# Patient Record
Sex: Female | Born: 1935
Health system: Southern US, Community
[De-identification: ages and names within clinical notes are randomized; demographics above are authoritative.]

## PROBLEM LIST (undated history)

## (undated) DIAGNOSIS — C679 Malignant neoplasm of bladder, unspecified: Secondary | ICD-10-CM

## (undated) DIAGNOSIS — E785 Hyperlipidemia, unspecified: Secondary | ICD-10-CM

## (undated) DIAGNOSIS — F0390 Unspecified dementia without behavioral disturbance: Secondary | ICD-10-CM

## (undated) DIAGNOSIS — I10 Essential (primary) hypertension: Secondary | ICD-10-CM

## (undated) HISTORY — PX: RADICAL HYSTERECTOMY WITH TRANSPOSITION OF OVARIES: SHX6222

## (undated) HISTORY — PX: CHOLECYSTECTOMY: SHX55

## (undated) HISTORY — DX: Hyperlipidemia, unspecified: E78.5

## (undated) HISTORY — DX: Essential (primary) hypertension: I10

---

## 1999-02-21 ENCOUNTER — Encounter: Payer: Self-pay | Admitting: Urology

## 1999-02-26 ENCOUNTER — Ambulatory Visit (HOSPITAL_COMMUNITY): Admission: RE | Admit: 1999-02-26 | Discharge: 1999-02-26 | Payer: Self-pay | Admitting: Urology

## 1999-06-23 ENCOUNTER — Ambulatory Visit (HOSPITAL_COMMUNITY): Admission: RE | Admit: 1999-06-23 | Discharge: 1999-06-23 | Payer: Self-pay | Admitting: Urology

## 1999-08-06 ENCOUNTER — Ambulatory Visit (HOSPITAL_COMMUNITY): Admission: RE | Admit: 1999-08-06 | Discharge: 1999-08-06 | Payer: Self-pay | Admitting: Urology

## 1999-08-19 ENCOUNTER — Ambulatory Visit (HOSPITAL_COMMUNITY): Admission: RE | Admit: 1999-08-19 | Discharge: 1999-08-19 | Payer: Self-pay | Admitting: Gastroenterology

## 2000-03-11 ENCOUNTER — Ambulatory Visit (HOSPITAL_COMMUNITY): Admission: RE | Admit: 2000-03-11 | Discharge: 2000-03-11 | Payer: Self-pay | Admitting: Urology

## 2000-09-21 ENCOUNTER — Encounter: Payer: Self-pay | Admitting: Urology

## 2000-09-22 ENCOUNTER — Ambulatory Visit (HOSPITAL_COMMUNITY): Admission: RE | Admit: 2000-09-22 | Discharge: 2000-09-22 | Payer: Self-pay | Admitting: Urology

## 2000-10-21 ENCOUNTER — Other Ambulatory Visit: Admission: RE | Admit: 2000-10-21 | Discharge: 2000-10-21 | Payer: Self-pay | Admitting: Urology

## 2001-07-11 ENCOUNTER — Encounter: Admission: RE | Admit: 2001-07-11 | Discharge: 2001-07-11 | Payer: Self-pay | Admitting: Urology

## 2001-07-11 ENCOUNTER — Encounter: Payer: Self-pay | Admitting: Urology

## 2001-07-13 ENCOUNTER — Ambulatory Visit (HOSPITAL_BASED_OUTPATIENT_CLINIC_OR_DEPARTMENT_OTHER): Admission: RE | Admit: 2001-07-13 | Discharge: 2001-07-13 | Payer: Self-pay | Admitting: Urology

## 2001-12-08 ENCOUNTER — Ambulatory Visit (HOSPITAL_BASED_OUTPATIENT_CLINIC_OR_DEPARTMENT_OTHER): Admission: RE | Admit: 2001-12-08 | Discharge: 2001-12-08 | Payer: Self-pay | Admitting: Urology

## 2002-02-28 ENCOUNTER — Other Ambulatory Visit: Admission: RE | Admit: 2002-02-28 | Discharge: 2002-02-28 | Payer: Self-pay | Admitting: Urology

## 2002-06-15 ENCOUNTER — Ambulatory Visit (HOSPITAL_COMMUNITY): Admission: RE | Admit: 2002-06-15 | Discharge: 2002-06-15 | Payer: Self-pay | Admitting: Urology

## 2002-11-27 ENCOUNTER — Ambulatory Visit (HOSPITAL_COMMUNITY): Admission: RE | Admit: 2002-11-27 | Discharge: 2002-11-27 | Payer: Self-pay | Admitting: Gastroenterology

## 2003-02-16 ENCOUNTER — Encounter: Admission: RE | Admit: 2003-02-16 | Discharge: 2003-02-16 | Payer: Self-pay | Admitting: Urology

## 2003-02-16 ENCOUNTER — Encounter: Payer: Self-pay | Admitting: Urology

## 2003-02-21 ENCOUNTER — Ambulatory Visit (HOSPITAL_BASED_OUTPATIENT_CLINIC_OR_DEPARTMENT_OTHER): Admission: RE | Admit: 2003-02-21 | Discharge: 2003-02-21 | Payer: Self-pay | Admitting: Urology

## 2003-08-08 ENCOUNTER — Ambulatory Visit (HOSPITAL_COMMUNITY): Admission: RE | Admit: 2003-08-08 | Discharge: 2003-08-08 | Payer: Self-pay | Admitting: Urology

## 2003-08-08 ENCOUNTER — Ambulatory Visit (HOSPITAL_BASED_OUTPATIENT_CLINIC_OR_DEPARTMENT_OTHER): Admission: RE | Admit: 2003-08-08 | Discharge: 2003-08-08 | Payer: Self-pay | Admitting: Urology

## 2004-02-04 ENCOUNTER — Encounter: Admission: RE | Admit: 2004-02-04 | Discharge: 2004-02-04 | Payer: Self-pay | Admitting: Urology

## 2004-02-06 ENCOUNTER — Ambulatory Visit (HOSPITAL_BASED_OUTPATIENT_CLINIC_OR_DEPARTMENT_OTHER): Admission: RE | Admit: 2004-02-06 | Discharge: 2004-02-06 | Payer: Self-pay | Admitting: Urology

## 2004-02-06 ENCOUNTER — Ambulatory Visit (HOSPITAL_COMMUNITY): Admission: RE | Admit: 2004-02-06 | Discharge: 2004-02-06 | Payer: Self-pay | Admitting: Urology

## 2004-03-26 ENCOUNTER — Other Ambulatory Visit: Admission: RE | Admit: 2004-03-26 | Discharge: 2004-03-26 | Payer: Self-pay | Admitting: Obstetrics and Gynecology

## 2011-03-09 ENCOUNTER — Other Ambulatory Visit: Payer: Self-pay | Admitting: Obstetrics and Gynecology

## 2011-03-09 ENCOUNTER — Other Ambulatory Visit (HOSPITAL_COMMUNITY)
Admission: RE | Admit: 2011-03-09 | Discharge: 2011-03-09 | Disposition: A | Discharge status: Home / Self Care | Payer: Medicare Other | Source: Ambulatory Visit | Attending: Obstetrics and Gynecology | Admitting: Obstetrics and Gynecology

## 2011-03-09 DIAGNOSIS — Z124 Encounter for screening for malignant neoplasm of cervix: Secondary | ICD-10-CM | POA: Insufficient documentation

## 2011-04-13 ENCOUNTER — Ambulatory Visit: Payer: Self-pay | Admitting: Gynecologic Oncology

## 2011-04-14 ENCOUNTER — Ambulatory Visit: Payer: Self-pay | Admitting: Cardiothoracic Surgery

## 2011-04-17 ENCOUNTER — Ambulatory Visit: Payer: Self-pay | Admitting: Gynecologic Oncology

## 2011-04-27 ENCOUNTER — Ambulatory Visit: Payer: Self-pay | Admitting: Gynecologic Oncology

## 2011-04-29 ENCOUNTER — Inpatient Hospital Stay: Payer: Self-pay | Admitting: Oncology

## 2011-04-30 LAB — PATHOLOGY REPORT

## 2011-05-02 ENCOUNTER — Ambulatory Visit: Payer: Self-pay | Admitting: Cardiothoracic Surgery

## 2011-05-28 ENCOUNTER — Ambulatory Visit: Payer: Self-pay | Admitting: Gynecologic Oncology

## 2011-05-28 ENCOUNTER — Ambulatory Visit: Payer: Self-pay | Admitting: Cardiothoracic Surgery

## 2011-08-18 DIAGNOSIS — Z1231 Encounter for screening mammogram for malignant neoplasm of breast: Secondary | ICD-10-CM | POA: Diagnosis not present

## 2011-10-14 DIAGNOSIS — I10 Essential (primary) hypertension: Secondary | ICD-10-CM | POA: Diagnosis not present

## 2011-10-14 DIAGNOSIS — Z1331 Encounter for screening for depression: Secondary | ICD-10-CM | POA: Diagnosis not present

## 2011-10-14 DIAGNOSIS — K219 Gastro-esophageal reflux disease without esophagitis: Secondary | ICD-10-CM | POA: Diagnosis not present

## 2011-10-14 DIAGNOSIS — E7800 Pure hypercholesterolemia, unspecified: Secondary | ICD-10-CM | POA: Diagnosis not present

## 2011-10-14 DIAGNOSIS — N183 Chronic kidney disease, stage 3 unspecified: Secondary | ICD-10-CM | POA: Diagnosis not present

## 2011-11-16 DIAGNOSIS — H04129 Dry eye syndrome of unspecified lacrimal gland: Secondary | ICD-10-CM | POA: Diagnosis not present

## 2011-12-01 DIAGNOSIS — Z09 Encounter for follow-up examination after completed treatment for conditions other than malignant neoplasm: Secondary | ICD-10-CM | POA: Diagnosis not present

## 2011-12-01 DIAGNOSIS — Z86010 Personal history of colonic polyps: Secondary | ICD-10-CM | POA: Diagnosis not present

## 2011-12-01 DIAGNOSIS — K5730 Diverticulosis of large intestine without perforation or abscess without bleeding: Secondary | ICD-10-CM | POA: Diagnosis not present

## 2011-12-08 DIAGNOSIS — Z8551 Personal history of malignant neoplasm of bladder: Secondary | ICD-10-CM | POA: Diagnosis not present

## 2011-12-08 DIAGNOSIS — N3941 Urge incontinence: Secondary | ICD-10-CM | POA: Diagnosis not present

## 2011-12-08 DIAGNOSIS — R82998 Other abnormal findings in urine: Secondary | ICD-10-CM | POA: Diagnosis not present

## 2011-12-17 DIAGNOSIS — Z8551 Personal history of malignant neoplasm of bladder: Secondary | ICD-10-CM | POA: Diagnosis not present

## 2012-04-28 DIAGNOSIS — Z23 Encounter for immunization: Secondary | ICD-10-CM | POA: Diagnosis not present

## 2012-06-03 DIAGNOSIS — E7800 Pure hypercholesterolemia, unspecified: Secondary | ICD-10-CM | POA: Diagnosis not present

## 2012-06-03 DIAGNOSIS — I10 Essential (primary) hypertension: Secondary | ICD-10-CM | POA: Diagnosis not present

## 2012-06-03 DIAGNOSIS — R609 Edema, unspecified: Secondary | ICD-10-CM | POA: Diagnosis not present

## 2012-06-20 DIAGNOSIS — M722 Plantar fascial fibromatosis: Secondary | ICD-10-CM | POA: Diagnosis not present

## 2012-07-11 DIAGNOSIS — I10 Essential (primary) hypertension: Secondary | ICD-10-CM | POA: Diagnosis not present

## 2012-08-29 DIAGNOSIS — N183 Chronic kidney disease, stage 3 unspecified: Secondary | ICD-10-CM | POA: Diagnosis not present

## 2012-08-29 DIAGNOSIS — K219 Gastro-esophageal reflux disease without esophagitis: Secondary | ICD-10-CM | POA: Diagnosis not present

## 2012-08-29 DIAGNOSIS — I10 Essential (primary) hypertension: Secondary | ICD-10-CM | POA: Diagnosis not present

## 2012-08-29 DIAGNOSIS — K5900 Constipation, unspecified: Secondary | ICD-10-CM | POA: Diagnosis not present

## 2012-09-14 DIAGNOSIS — Z1231 Encounter for screening mammogram for malignant neoplasm of breast: Secondary | ICD-10-CM | POA: Diagnosis not present

## 2012-09-14 DIAGNOSIS — D649 Anemia, unspecified: Secondary | ICD-10-CM | POA: Diagnosis not present

## 2012-09-14 DIAGNOSIS — R7301 Impaired fasting glucose: Secondary | ICD-10-CM | POA: Diagnosis not present

## 2012-09-28 DIAGNOSIS — Z01419 Encounter for gynecological examination (general) (routine) without abnormal findings: Secondary | ICD-10-CM | POA: Diagnosis not present

## 2012-11-18 DIAGNOSIS — H04129 Dry eye syndrome of unspecified lacrimal gland: Secondary | ICD-10-CM | POA: Diagnosis not present

## 2012-12-01 DIAGNOSIS — N183 Chronic kidney disease, stage 3 unspecified: Secondary | ICD-10-CM | POA: Diagnosis not present

## 2012-12-01 DIAGNOSIS — E7800 Pure hypercholesterolemia, unspecified: Secondary | ICD-10-CM | POA: Diagnosis not present

## 2012-12-01 DIAGNOSIS — Z1331 Encounter for screening for depression: Secondary | ICD-10-CM | POA: Diagnosis not present

## 2012-12-01 DIAGNOSIS — I10 Essential (primary) hypertension: Secondary | ICD-10-CM | POA: Diagnosis not present

## 2012-12-12 DIAGNOSIS — Z8551 Personal history of malignant neoplasm of bladder: Secondary | ICD-10-CM | POA: Diagnosis not present

## 2013-05-01 DIAGNOSIS — Z23 Encounter for immunization: Secondary | ICD-10-CM | POA: Diagnosis not present

## 2013-06-01 DIAGNOSIS — N183 Chronic kidney disease, stage 3 unspecified: Secondary | ICD-10-CM | POA: Diagnosis not present

## 2013-06-01 DIAGNOSIS — K219 Gastro-esophageal reflux disease without esophagitis: Secondary | ICD-10-CM | POA: Diagnosis not present

## 2013-06-01 DIAGNOSIS — I10 Essential (primary) hypertension: Secondary | ICD-10-CM | POA: Diagnosis not present

## 2013-06-01 DIAGNOSIS — E7800 Pure hypercholesterolemia, unspecified: Secondary | ICD-10-CM | POA: Diagnosis not present

## 2013-06-21 DIAGNOSIS — M899 Disorder of bone, unspecified: Secondary | ICD-10-CM | POA: Diagnosis not present

## 2013-08-03 DIAGNOSIS — I10 Essential (primary) hypertension: Secondary | ICD-10-CM | POA: Diagnosis not present

## 2013-09-15 DIAGNOSIS — R922 Inconclusive mammogram: Secondary | ICD-10-CM | POA: Diagnosis not present

## 2013-09-15 DIAGNOSIS — Z1231 Encounter for screening mammogram for malignant neoplasm of breast: Secondary | ICD-10-CM | POA: Diagnosis not present

## 2013-09-27 ENCOUNTER — Encounter: Payer: Self-pay | Admitting: Podiatry

## 2013-09-27 ENCOUNTER — Ambulatory Visit (INDEPENDENT_AMBULATORY_CARE_PROVIDER_SITE_OTHER): Payer: Medicare Other | Admitting: Podiatry

## 2013-09-27 VITALS — BP 153/69 | HR 57 | Resp 16 | Ht 62.0 in | Wt 112.0 lb

## 2013-09-27 DIAGNOSIS — L03039 Cellulitis of unspecified toe: Secondary | ICD-10-CM

## 2013-09-27 NOTE — Progress Notes (Signed)
   Subjective:    Patient ID: Pamela Woods, female    DOB: 05-12-1922, 80 y.o.   MRN: [id number]  HPI N ingrown toenail        L right 1st medial toenail        D and O over 1 week        C pain        A pressure, and enclosed shoes        T to painful to touch or trim    Review of Systems  All other systems reviewed and are negative.       Objective:   Physical Exam        Assessment & Plan:

## 2013-09-27 NOTE — Patient Instructions (Signed)

## 2013-09-28 ENCOUNTER — Ambulatory Visit: Payer: Self-pay | Admitting: Podiatry

## 2013-09-28 NOTE — Progress Notes (Signed)
Subjective:     Patient ID: Pamela Woods, female   DOB: 07-05-1922, 80 y.o.   MRN: [id number]  HPI patient points with and painful ingrown toenail right hallux medial border that she's tried to trim and soak without relief. Presents with caregiver   Review of Systems     Objective:   Physical Exam Neurovascular status unchanged with patient well oriented x3 with incurvated medial border the right hallux is painful with slight distal redness noted    Assessment:     Paronychia the right hallux medial border    Plan:     H&P performed and today have recommended removal the border. Infiltrated the right hallux 60 mg Xylocaine Marcaine mixture and remove the medial border and a small amount of proud flesh. Applied sterile dressing and reappoint

## 2013-10-05 DIAGNOSIS — Z01419 Encounter for gynecological examination (general) (routine) without abnormal findings: Secondary | ICD-10-CM | POA: Diagnosis not present

## 2013-11-28 DIAGNOSIS — Z8551 Personal history of malignant neoplasm of bladder: Secondary | ICD-10-CM | POA: Diagnosis not present

## 2013-11-30 DIAGNOSIS — N183 Chronic kidney disease, stage 3 unspecified: Secondary | ICD-10-CM | POA: Diagnosis not present

## 2013-11-30 DIAGNOSIS — I10 Essential (primary) hypertension: Secondary | ICD-10-CM | POA: Diagnosis not present

## 2013-11-30 DIAGNOSIS — E7800 Pure hypercholesterolemia, unspecified: Secondary | ICD-10-CM | POA: Diagnosis not present

## 2013-11-30 DIAGNOSIS — K219 Gastro-esophageal reflux disease without esophagitis: Secondary | ICD-10-CM | POA: Diagnosis not present

## 2013-11-30 DIAGNOSIS — B9789 Other viral agents as the cause of diseases classified elsewhere: Secondary | ICD-10-CM | POA: Diagnosis not present

## 2013-11-30 DIAGNOSIS — H6120 Impacted cerumen, unspecified ear: Secondary | ICD-10-CM | POA: Diagnosis not present

## 2013-12-01 DIAGNOSIS — R42 Dizziness and giddiness: Secondary | ICD-10-CM | POA: Diagnosis not present

## 2013-12-01 DIAGNOSIS — H9209 Otalgia, unspecified ear: Secondary | ICD-10-CM | POA: Diagnosis not present

## 2013-12-08 DIAGNOSIS — H35039 Hypertensive retinopathy, unspecified eye: Secondary | ICD-10-CM | POA: Diagnosis not present

## 2013-12-08 DIAGNOSIS — H43819 Vitreous degeneration, unspecified eye: Secondary | ICD-10-CM | POA: Diagnosis not present

## 2013-12-08 DIAGNOSIS — H04129 Dry eye syndrome of unspecified lacrimal gland: Secondary | ICD-10-CM | POA: Diagnosis not present

## 2013-12-29 DIAGNOSIS — N184 Chronic kidney disease, stage 4 (severe): Secondary | ICD-10-CM | POA: Diagnosis not present

## 2014-03-14 DIAGNOSIS — B351 Tinea unguium: Secondary | ICD-10-CM | POA: Diagnosis not present

## 2014-03-14 DIAGNOSIS — M79609 Pain in unspecified limb: Secondary | ICD-10-CM | POA: Diagnosis not present

## 2014-03-14 DIAGNOSIS — I739 Peripheral vascular disease, unspecified: Secondary | ICD-10-CM | POA: Diagnosis not present

## 2014-04-13 DIAGNOSIS — H43819 Vitreous degeneration, unspecified eye: Secondary | ICD-10-CM | POA: Diagnosis not present

## 2014-04-13 DIAGNOSIS — H35369 Drusen (degenerative) of macula, unspecified eye: Secondary | ICD-10-CM | POA: Diagnosis not present

## 2014-04-13 DIAGNOSIS — H353190 Nonexudative age-related macular degeneration, unspecified eye, stage unspecified: Secondary | ICD-10-CM | POA: Diagnosis not present

## 2014-04-13 DIAGNOSIS — H35039 Hypertensive retinopathy, unspecified eye: Secondary | ICD-10-CM | POA: Diagnosis not present

## 2014-04-19 ENCOUNTER — Encounter: Payer: Self-pay | Admitting: Podiatry

## 2014-04-19 ENCOUNTER — Ambulatory Visit (INDEPENDENT_AMBULATORY_CARE_PROVIDER_SITE_OTHER): Payer: Medicare Other | Admitting: Podiatry

## 2014-04-19 VITALS — BP 135/65 | HR 57 | Resp 16

## 2014-04-19 DIAGNOSIS — L03039 Cellulitis of unspecified toe: Secondary | ICD-10-CM

## 2014-04-19 NOTE — Patient Instructions (Signed)
ANTIBACTERIAL SOAP INSTRUCTIONS  THE DAY AFTER PROCEDURE  Please follow the instructions your doctor has marked.   Shower as usual. Before getting out, place a drop of antibacterial liquid soap (Dial) on a wet, clean washcloth.  Gently wipe washcloth over affected area.  Afterward, rinse the area with warm water.  Blot the area dry with a soft cloth and cover with antibiotic ointment (neosporin, polysporin, bacitracin) and band aid or gauze and tape  OR   Place 3-4 drops of antibacterial liquid soap in a quart of warm tap water.  Submerge foot into water for 20 minutes.  If bandage was applied after your procedure, leave on to allow for easy lift off, then remove and continue with soak for the remaining time.  Next, blot area dry with a soft cloth and cover with a bandage.  Apply other medications as directed by your doctor, such as cortisporin otic solution (eardrops) or neosporin antibiotic ointment   Long Term Care Instructions-Post Nail Surgery  You have had your ingrown toenail and root treated with a chemical.  This chemical causes a burn that will drain and ooze like a blister.  This can drain for 6-8 weeks or longer.  It is important to keep this area clean, covered, and follow the soaking instructions dispensed at the time of your surgery.  This area will eventually dry and form a scab.  Once the scab forms you no longer need to soak or apply a dressing.  If at any time you experience an increase in pain, redness, swelling, or drainage, you should contact the office as soon as possible.

## 2014-04-20 NOTE — Progress Notes (Signed)
Subjective:     Patient ID: Pamela Woods, female   DOB: 01-17-1922, 80 y.o.   MRN: [id number]  HPI patient presents with daughter with inflammation and pain around the medial border of the left hallux nail stating she was in [place] and had trim which did not help her  Review of Systems     Objective:   Physical Exam Neurovascular status is intact with no other changes in health history with inflamed left hallux medial border and pain with digits that are noted to be well perfused    Assessment:     Paronychia infection that is localized of the left hallux medial border    Plan:     Condition discussed and today I infiltrated 60 mg Xylocaine Marcaine mixture and applied sterile prep to the hallux. Using sterile his mentation I removed the medial border proud flesh and necrotic tissue and allowed channel for drainage. This appears to be localized with no proximal issues and healing were to not occur or if there is any redness swelling or drainage patient and patient's daughter are to let us know and come in for check

## 2014-04-24 DIAGNOSIS — H9190 Unspecified hearing loss, unspecified ear: Secondary | ICD-10-CM | POA: Diagnosis not present

## 2014-04-24 DIAGNOSIS — H6120 Impacted cerumen, unspecified ear: Secondary | ICD-10-CM | POA: Diagnosis not present

## 2014-04-29 DIAGNOSIS — Z23 Encounter for immunization: Secondary | ICD-10-CM | POA: Diagnosis not present

## 2014-05-09 DIAGNOSIS — H9193 Unspecified hearing loss, bilateral: Secondary | ICD-10-CM | POA: Diagnosis not present

## 2014-05-09 DIAGNOSIS — H9122 Sudden idiopathic hearing loss, left ear: Secondary | ICD-10-CM | POA: Diagnosis not present

## 2014-05-09 DIAGNOSIS — H905 Unspecified sensorineural hearing loss: Secondary | ICD-10-CM | POA: Diagnosis not present

## 2014-05-30 DIAGNOSIS — H905 Unspecified sensorineural hearing loss: Secondary | ICD-10-CM | POA: Diagnosis not present

## 2014-05-30 DIAGNOSIS — H9193 Unspecified hearing loss, bilateral: Secondary | ICD-10-CM | POA: Diagnosis not present

## 2014-05-30 DIAGNOSIS — H9122 Sudden idiopathic hearing loss, left ear: Secondary | ICD-10-CM | POA: Diagnosis not present

## 2014-06-07 DIAGNOSIS — Z8551 Personal history of malignant neoplasm of bladder: Secondary | ICD-10-CM | POA: Diagnosis not present

## 2014-06-07 DIAGNOSIS — E780 Pure hypercholesterolemia: Secondary | ICD-10-CM | POA: Diagnosis not present

## 2014-06-07 DIAGNOSIS — I129 Hypertensive chronic kidney disease with stage 1 through stage 4 chronic kidney disease, or unspecified chronic kidney disease: Secondary | ICD-10-CM | POA: Diagnosis not present

## 2014-06-07 DIAGNOSIS — N183 Chronic kidney disease, stage 3 (moderate): Secondary | ICD-10-CM | POA: Diagnosis not present

## 2014-06-07 DIAGNOSIS — K219 Gastro-esophageal reflux disease without esophagitis: Secondary | ICD-10-CM | POA: Diagnosis not present

## 2014-07-02 ENCOUNTER — Encounter: Payer: Self-pay | Admitting: Podiatry

## 2014-07-02 ENCOUNTER — Ambulatory Visit (INDEPENDENT_AMBULATORY_CARE_PROVIDER_SITE_OTHER): Payer: Medicare Other | Admitting: Podiatry

## 2014-07-02 DIAGNOSIS — M79673 Pain in unspecified foot: Secondary | ICD-10-CM

## 2014-07-02 DIAGNOSIS — B351 Tinea unguium: Secondary | ICD-10-CM

## 2014-07-02 NOTE — Progress Notes (Signed)
Subjective:     Patient ID: Pamela Woods, female   DOB: [birth year], 80 y.o.   MRN: [id number]  HPI patient presents with painful nail disease that are thick yellow and brittle 1-5 of both feet   Review of Systems     Objective:   Physical Exam Neurovascular status intact with thick yellow brittle nailbeds 1-5 both feet    Assessment:     Mycotic nail infection with pain 1-5 both feet    Plan:     Debride painful nailbeds 1-5 both feet with no iatrogenic bleeding noted

## 2014-10-01 ENCOUNTER — Ambulatory Visit: Payer: Medicare Other

## 2014-10-05 ENCOUNTER — Encounter: Payer: Self-pay | Admitting: Podiatry

## 2014-10-05 ENCOUNTER — Ambulatory Visit (INDEPENDENT_AMBULATORY_CARE_PROVIDER_SITE_OTHER): Payer: Medicare Other | Admitting: Podiatry

## 2014-10-05 DIAGNOSIS — M79676 Pain in unspecified toe(s): Secondary | ICD-10-CM | POA: Diagnosis not present

## 2014-10-05 DIAGNOSIS — B351 Tinea unguium: Secondary | ICD-10-CM

## 2014-10-06 NOTE — Progress Notes (Signed)
Patient ID: Pamela Woods, female   DOB: [birth year], 81 y.o.   MRN: [id number]  Subjective: 81 y.o.-year-old female returns the office today for painful, elongated, thickened toenails which she is unable to trim herself. Denies any redness or drainage around the nails. Denies any acute changes since last appointment and no new complaints today. Denies any systemic complaints such as fevers, chills, nausea, vomiting.   Objective: AAO 3, NAD DP/PT pulses palpable, CRT less than 3 seconds Protective sensation intact with Simms Weinstein monofilament, Achilles tendon reflex intact.  Nails hypertrophic, dystrophic, elongated, brittle, discolored 10. There is tenderness overlying the nails 1-5 bilaterally. There is no surrounding erythema or drainage along the nail sites. No open lesions or pre-ulcerative lesions are identified. No other areas of tenderness bilateral lower extremities. No overlying edema, erythema, increased warmth. No pain with calf compression, swelling, warmth, erythema.  Assessment: Patient presents with symptomatic onychomycosis  Plan: -Treatment options including alternatives, risks, complications were discussed -Nails sharply debrided 10 without complication/bleeding. -Discussed daily foot inspection. If there are any changes, to call the office immediately.  -Follow-up in 3 months or sooner if any problems are to arise. In the meantime, encouraged to call the office with any questions, concerns, changes symptoms.

## 2014-11-03 DIAGNOSIS — Z1231 Encounter for screening mammogram for malignant neoplasm of breast: Secondary | ICD-10-CM | POA: Diagnosis not present

## 2014-11-06 DIAGNOSIS — Z0000 Encounter for general adult medical examination without abnormal findings: Secondary | ICD-10-CM | POA: Diagnosis not present

## 2014-12-03 DIAGNOSIS — E780 Pure hypercholesterolemia: Secondary | ICD-10-CM | POA: Diagnosis not present

## 2014-12-03 DIAGNOSIS — N183 Chronic kidney disease, stage 3 (moderate): Secondary | ICD-10-CM | POA: Diagnosis not present

## 2014-12-03 DIAGNOSIS — R634 Abnormal weight loss: Secondary | ICD-10-CM | POA: Diagnosis not present

## 2014-12-03 DIAGNOSIS — I129 Hypertensive chronic kidney disease with stage 1 through stage 4 chronic kidney disease, or unspecified chronic kidney disease: Secondary | ICD-10-CM | POA: Diagnosis not present

## 2014-12-03 DIAGNOSIS — K219 Gastro-esophageal reflux disease without esophagitis: Secondary | ICD-10-CM | POA: Diagnosis not present

## 2014-12-03 DIAGNOSIS — R4181 Age-related cognitive decline: Secondary | ICD-10-CM | POA: Diagnosis not present

## 2014-12-07 DIAGNOSIS — D090 Carcinoma in situ of bladder: Secondary | ICD-10-CM | POA: Diagnosis not present

## 2015-03-19 DIAGNOSIS — B351 Tinea unguium: Secondary | ICD-10-CM | POA: Diagnosis not present

## 2015-03-19 DIAGNOSIS — I739 Peripheral vascular disease, unspecified: Secondary | ICD-10-CM | POA: Diagnosis not present

## 2015-04-19 DIAGNOSIS — Z23 Encounter for immunization: Secondary | ICD-10-CM | POA: Diagnosis not present

## 2015-05-21 DIAGNOSIS — H04123 Dry eye syndrome of bilateral lacrimal glands: Secondary | ICD-10-CM | POA: Diagnosis not present

## 2015-05-21 DIAGNOSIS — H524 Presbyopia: Secondary | ICD-10-CM | POA: Diagnosis not present

## 2015-05-21 DIAGNOSIS — H52223 Regular astigmatism, bilateral: Secondary | ICD-10-CM | POA: Diagnosis not present

## 2015-05-21 DIAGNOSIS — H43813 Vitreous degeneration, bilateral: Secondary | ICD-10-CM | POA: Diagnosis not present

## 2015-05-21 DIAGNOSIS — H353131 Nonexudative age-related macular degeneration, bilateral, early dry stage: Secondary | ICD-10-CM | POA: Diagnosis not present

## 2015-06-17 DIAGNOSIS — N183 Chronic kidney disease, stage 3 (moderate): Secondary | ICD-10-CM | POA: Diagnosis not present

## 2015-06-17 DIAGNOSIS — H3530 Unspecified macular degeneration: Secondary | ICD-10-CM | POA: Diagnosis not present

## 2015-06-17 DIAGNOSIS — I129 Hypertensive chronic kidney disease with stage 1 through stage 4 chronic kidney disease, or unspecified chronic kidney disease: Secondary | ICD-10-CM | POA: Diagnosis not present

## 2015-06-17 DIAGNOSIS — G301 Alzheimer's disease with late onset: Secondary | ICD-10-CM | POA: Diagnosis not present

## 2015-06-17 DIAGNOSIS — K219 Gastro-esophageal reflux disease without esophagitis: Secondary | ICD-10-CM | POA: Diagnosis not present

## 2015-07-03 DIAGNOSIS — R42 Dizziness and giddiness: Secondary | ICD-10-CM | POA: Diagnosis not present

## 2015-07-20 ENCOUNTER — Encounter (HOSPITAL_COMMUNITY): Payer: Self-pay

## 2015-07-20 ENCOUNTER — Emergency Department (HOSPITAL_COMMUNITY)
Admission: EM | Admit: 2015-07-20 | Discharge: 2015-07-20 | Disposition: A | Discharge status: Home / Self Care | Payer: Medicare Other | Attending: Emergency Medicine | Admitting: Emergency Medicine

## 2015-07-20 ENCOUNTER — Emergency Department (HOSPITAL_COMMUNITY): Payer: Medicare Other

## 2015-07-20 DIAGNOSIS — Z8639 Personal history of other endocrine, nutritional and metabolic disease: Secondary | ICD-10-CM | POA: Diagnosis not present

## 2015-07-20 DIAGNOSIS — Z880 Allergy status to penicillin: Secondary | ICD-10-CM | POA: Insufficient documentation

## 2015-07-20 DIAGNOSIS — I10 Essential (primary) hypertension: Secondary | ICD-10-CM | POA: Diagnosis not present

## 2015-07-20 DIAGNOSIS — R6889 Other general symptoms and signs: Secondary | ICD-10-CM

## 2015-07-20 DIAGNOSIS — R000 Tachycardia, unspecified: Secondary | ICD-10-CM | POA: Diagnosis not present

## 2015-07-20 DIAGNOSIS — J029 Acute pharyngitis, unspecified: Secondary | ICD-10-CM | POA: Diagnosis present

## 2015-07-20 DIAGNOSIS — Z79899 Other long term (current) drug therapy: Secondary | ICD-10-CM | POA: Diagnosis not present

## 2015-07-20 DIAGNOSIS — R0989 Other specified symptoms and signs involving the circulatory and respiratory systems: Secondary | ICD-10-CM

## 2015-07-20 DIAGNOSIS — R070 Pain in throat: Secondary | ICD-10-CM | POA: Insufficient documentation

## 2015-07-20 NOTE — Discharge Instructions (Signed)
Return to the ED with any concerns including difficulty breathing or swallowing, fever/chills, vomiting and not able to keep down liquids, decreased level of alertness/lethargy, or any other alarming symptoms

## 2015-07-20 NOTE — ED Notes (Signed)
Pt given ginger ale for fluid challenge and is tolerating w/o difficulty.  Pt denies feeling as if something is stuck in her throat.

## 2015-07-20 NOTE — ED Provider Notes (Signed)
CSN: [id number]     Arrival date & time 07/20/15  1639 History   First MD Initiated Contact with Patient 07/20/15 1716     Chief Complaint  Patient presents with  . Sore Throat     (Consider location/radiation/quality/duration/timing/severity/associated sxs/prior Treatment) HPI  Pt presenting with c/o feeling a tightness in her throat earlier this evening. She states she cleared her throat and coughed a bit and felt better.  Family states her voice was initially somewhat weak but is now back to baseline.  Denies choking, had not been recently eating.  No fever/chills. No sore throat.  Pt has no current complaints.  No focal weakness. No changes in vision or dysarthria.  Symptoms began approx 3:30pm but are resolved at this time.  There are no other associated systemic symptoms, there are no other alleviating or modifying factors.   Past Medical History  Diagnosis Date  . Hypertension   . Hyperlipidemia    Past Surgical History  Procedure Laterality Date  . Radical hysterectomy with transposition of ovaries Bilateral     removal of ovaries   No family history on file. Social History  Substance Use Topics  . Smoking status: Never Smoker   . Smokeless tobacco: None  . Alcohol Use: None   OB History    No data available     Review of Systems  ROS reviewed and all otherwise negative except for mentioned in HPI    Allergies  Ampicillin; Cephalosporins; Doxycycline; Macrobid; Relafen; and Sulfur  Home Medications   Prior to Admission medications   Medication Sig Start Date End Date Taking? Authorizing Provider  amLODipine (NORVASC) 5 MG tablet Take 5 mg by mouth daily. 06/27/15  Yes Historical Provider, MD  carvedilol (COREG) 12.5 MG tablet Take 12.5 mg by mouth daily.   Yes Historical Provider, MD  cholecalciferol (VITAMIN D) 1000 UNITS tablet Take 1,000 Units by mouth daily.   Yes Historical Provider, MD  olmesartan-hydrochlorothiazide (BENICAR HCT) 20-12.5 MG tablet Take  1 tablet by mouth daily. 06/27/15  Yes Historical Provider, MD  ranitidine (ZANTAC) 150 MG tablet Take 150 mg by mouth daily.    Yes Historical Provider, MD  vitamin C (ASCORBIC ACID) 500 MG tablet Take 500 mg by mouth daily.   Yes Historical Provider, MD   BP 137/58 mmHg  Pulse 57  Temp(Src) 97.9 F (36.6 C) (Oral)  Resp 17  SpO2 100%  Vitals reviewed Physical Exam  Physical Examination: General appearance - alert, well appearing, and in no distress Mental status - alert, oriented to person, place, and time Eyes - pupils equal and reactive, extraocular eye movements intact Mouth - mucous membranes moist, pharynx normal without lesions, palate symmetric, uvula midline Neck - supple, no significant adenopathy Chest - clear to auscultation, no wheezes, rales or rhonchi, symmetric air entry, no stridor, normal respiratory effort Heart - normal rate, regular rhythm, normal S1, S2, no murmurs, rubs, clicks or gallops Neurological - alert, oriented x 3, cranial nerves 2-12 tested and intact, strength 5/5 in extremities x 4, sensation intact Extremities - peripheral pulses normal, no pedal edema, no clubbing or cyanosis Skin - normal coloration and turgor, no rashes  ED Course  Procedures (including critical care time) Labs Review Labs Reviewed - No data to display  Imaging Review Dg Neck Soft Tissue  07/20/2015  CLINICAL DATA:  She c/o sudden loss of voice with some feeling of having difficulty breathing which began at APPROX.1530 hours today and persists. ; no known esophageal nor  throat issue; no pain per pt EXAM: NECK SOFT TISSUES - 1+ VIEW COMPARISON:  None. FINDINGS: There is no evidence of retropharyngeal soft tissue swelling or epiglottic enlargement. The cervical airway is unremarkable and no radio-opaque foreign body identified. Bilateral calcified carotid bifurcation plaque noted. Atheromatous aorta. Patient is edentulous. IMPRESSION: No acute findings or foreign body. Aortic and  carotid calcifications. Electronically Signed   By: Lucrezia Europe M.D.   On: 07/20/2015 19:01   I have personally reviewed and evaluated these images and lab results as part of my medical decision-making.   EKG Interpretation None      MDM   Final diagnoses:  Throat tightness    Pt presenting after symtpoms of feeling a sore throat or the need to clear her throat or some throat tightness.  She has no symptoms now.  She describes feeling the need to clear her throat and her voice felt weak briefly.  No difficulty breathing or stridor.  No focal weakness to suggest stroke.  No fever/chills or ongoing throat pain to suggest infection or abscess.  Soft tissue neck films are reassuring, airway is patent, no epiglotttis enlargement, no thickening of prevertebral tissues, no foreign body.  Pt is totally asymptomatic at this time.  Feel it is reasonable to discharge home, close f/u with PMD.  Discharged with strict return precautions.  Pt agreeable with plan.   Alfonzo Beers, MD 07/23/15 [phone]

## 2015-07-20 NOTE — ED Notes (Signed)
She c/o sudden loss of voice with some feeling of having difficulty breathing which began at ~1530 hours today and persists.  She is in no distress and her skin is normal, warm and dry--I take her directly to room.

## 2015-07-20 NOTE — ED Notes (Signed)
Pt transported to xray 

## 2015-08-14 DIAGNOSIS — N184 Chronic kidney disease, stage 4 (severe): Secondary | ICD-10-CM | POA: Diagnosis not present

## 2015-08-14 DIAGNOSIS — D631 Anemia in chronic kidney disease: Secondary | ICD-10-CM | POA: Diagnosis not present

## 2015-08-14 DIAGNOSIS — M8580 Other specified disorders of bone density and structure, unspecified site: Secondary | ICD-10-CM | POA: Diagnosis not present

## 2015-08-14 DIAGNOSIS — I129 Hypertensive chronic kidney disease with stage 1 through stage 4 chronic kidney disease, or unspecified chronic kidney disease: Secondary | ICD-10-CM | POA: Diagnosis not present

## 2015-08-14 DIAGNOSIS — K219 Gastro-esophageal reflux disease without esophagitis: Secondary | ICD-10-CM | POA: Diagnosis not present

## 2015-08-14 DIAGNOSIS — G309 Alzheimer's disease, unspecified: Secondary | ICD-10-CM | POA: Diagnosis not present

## 2015-08-14 DIAGNOSIS — E7800 Pure hypercholesterolemia, unspecified: Secondary | ICD-10-CM | POA: Diagnosis not present

## 2015-09-06 ENCOUNTER — Encounter: Payer: Self-pay | Admitting: Podiatry

## 2015-09-06 ENCOUNTER — Ambulatory Visit (INDEPENDENT_AMBULATORY_CARE_PROVIDER_SITE_OTHER): Payer: Medicare Other | Admitting: Podiatry

## 2015-09-06 DIAGNOSIS — B351 Tinea unguium: Secondary | ICD-10-CM

## 2015-09-06 DIAGNOSIS — M79676 Pain in unspecified toe(s): Secondary | ICD-10-CM

## 2015-09-06 NOTE — Progress Notes (Signed)
Patient ID: Pamela Woods, female   DOB: 02/05/1922, 82 y.o.   MRN: [id number] Complaint:  Visit Type: Patient returns to my office for continued preventative foot care services. Complaint: Patient states" my nails have grown long and thick and become painful to walk and wear shoes" . The patient presents for preventative foot care services. No changes to ROS.  She admits to throbbing pain in both big toes.  Podiatric Exam: Vascular: dorsalis pedis and posterior tibial pulses are palpable bilateral. Capillary return is immediate. Temperature gradient is WNL. Skin turgor WNL  Sensorium: Normal Semmes Weinstein monofilament test. Normal tactile sensation bilaterally. Nail Exam: Pt has thick disfigured discolored nails with subungual debris noted bilateral entire nail hallux through fifth toenails Ulcer Exam: There is no evidence of ulcer or pre-ulcerative changes or infection. Orthopedic Exam: Muscle tone and strength are WNL. No limitations in general ROM. No crepitus or effusions noted. Foot type and digits show no abnormalities. Bony prominences are unremarkable. Skin: No Porokeratosis. No infection or ulcers  Diagnosis:  Onychomycosis, , Pain in right toe, pain in left toes  Treatment & Plan Procedures and Treatment: Consent by patient was obtained for treatment procedures. The patient understood the discussion of treatment and procedures well. All questions were answered thoroughly reviewed. Debridement of mycotic and hypertrophic toenails, 1 through 5 bilateral and clearing of subungual debris. No ulceration, no infection noted.  Return Visit-Office Procedure: Patient instructed to return to the office for a follow up visit 3 months for continued evaluation and treatment.    Angelina Venard DPM 

## 2015-10-08 DIAGNOSIS — M859 Disorder of bone density and structure, unspecified: Secondary | ICD-10-CM | POA: Diagnosis not present

## 2015-10-08 DIAGNOSIS — M8589 Other specified disorders of bone density and structure, multiple sites: Secondary | ICD-10-CM | POA: Diagnosis not present

## 2015-10-11 DIAGNOSIS — L600 Ingrowing nail: Secondary | ICD-10-CM | POA: Diagnosis not present

## 2015-10-18 DIAGNOSIS — Z01411 Encounter for gynecological examination (general) (routine) with abnormal findings: Secondary | ICD-10-CM | POA: Diagnosis not present

## 2015-11-07 DIAGNOSIS — Z1231 Encounter for screening mammogram for malignant neoplasm of breast: Secondary | ICD-10-CM | POA: Diagnosis not present

## 2015-11-25 DIAGNOSIS — Z23 Encounter for immunization: Secondary | ICD-10-CM | POA: Diagnosis not present

## 2015-11-25 DIAGNOSIS — G309 Alzheimer's disease, unspecified: Secondary | ICD-10-CM | POA: Diagnosis not present

## 2015-11-25 DIAGNOSIS — N184 Chronic kidney disease, stage 4 (severe): Secondary | ICD-10-CM | POA: Diagnosis not present

## 2015-11-25 DIAGNOSIS — I129 Hypertensive chronic kidney disease with stage 1 through stage 4 chronic kidney disease, or unspecified chronic kidney disease: Secondary | ICD-10-CM | POA: Diagnosis not present

## 2015-11-25 DIAGNOSIS — E7800 Pure hypercholesterolemia, unspecified: Secondary | ICD-10-CM | POA: Diagnosis not present

## 2015-11-25 DIAGNOSIS — K219 Gastro-esophageal reflux disease without esophagitis: Secondary | ICD-10-CM | POA: Diagnosis not present

## 2015-11-25 DIAGNOSIS — M8580 Other specified disorders of bone density and structure, unspecified site: Secondary | ICD-10-CM | POA: Diagnosis not present

## 2015-11-25 DIAGNOSIS — D631 Anemia in chronic kidney disease: Secondary | ICD-10-CM | POA: Diagnosis not present

## 2015-12-03 ENCOUNTER — Encounter: Payer: Self-pay | Admitting: Podiatry

## 2015-12-03 ENCOUNTER — Ambulatory Visit (INDEPENDENT_AMBULATORY_CARE_PROVIDER_SITE_OTHER): Payer: Medicare Other | Admitting: Podiatry

## 2015-12-03 DIAGNOSIS — B351 Tinea unguium: Secondary | ICD-10-CM

## 2015-12-03 DIAGNOSIS — M79676 Pain in unspecified toe(s): Secondary | ICD-10-CM

## 2015-12-03 NOTE — Progress Notes (Signed)
Patient ID: Pamela Woods, female   DOB: 07/25/1922, 82 y.o.   MRN: [id number] Complaint:  Visit Type: Patient returns to my office for continued preventative foot care services. Complaint: Patient states" my nails have grown long and thick and become painful to walk and wear shoes" . The patient presents for preventative foot care services. No changes to ROS.  She admits to throbbing pain in both big toes.  Podiatric Exam: Vascular: dorsalis pedis and posterior tibial pulses are palpable bilateral. Capillary return is immediate. Temperature gradient is WNL. Skin turgor WNL  Sensorium: Normal Semmes Weinstein monofilament test. Normal tactile sensation bilaterally. Nail Exam: Pt has thick disfigured discolored nails with subungual debris noted bilateral entire nail hallux through fifth toenails Ulcer Exam: There is no evidence of ulcer or pre-ulcerative changes or infection. Orthopedic Exam: Muscle tone and strength are WNL. No limitations in general ROM. No crepitus or effusions noted. Foot type and digits show no abnormalities. Bony prominences are unremarkable. Skin: No Porokeratosis. No infection or ulcers  Diagnosis:  Onychomycosis, , Pain in right toe, pain in left toes  Treatment & Plan Procedures and Treatment: Consent by patient was obtained for treatment procedures. The patient understood the discussion of treatment and procedures well. All questions were answered thoroughly reviewed. Debridement of mycotic and hypertrophic toenails, 1 through 5 bilateral and clearing of subungual debris. No ulceration, no infection noted.  Return Visit-Office Procedure: Patient instructed to return to the office for a follow up visit 3 months for continued evaluation and treatment.    Esley Brooking DPM 

## 2015-12-06 ENCOUNTER — Ambulatory Visit: Payer: Medicare Other | Admitting: Podiatry

## 2016-01-01 DIAGNOSIS — M79674 Pain in right toe(s): Secondary | ICD-10-CM | POA: Diagnosis not present

## 2016-01-01 DIAGNOSIS — L600 Ingrowing nail: Secondary | ICD-10-CM | POA: Diagnosis not present

## 2016-01-08 DIAGNOSIS — R402362 Coma scale, best motor response, obeys commands, at arrival to emergency department: Secondary | ICD-10-CM | POA: Diagnosis not present

## 2016-01-08 DIAGNOSIS — R52 Pain, unspecified: Secondary | ICD-10-CM | POA: Diagnosis not present

## 2016-01-08 DIAGNOSIS — S12400A Unspecified displaced fracture of fifth cervical vertebra, initial encounter for closed fracture: Secondary | ICD-10-CM | POA: Diagnosis not present

## 2016-01-08 DIAGNOSIS — S12401A Unspecified nondisplaced fracture of fifth cervical vertebra, initial encounter for closed fracture: Secondary | ICD-10-CM | POA: Diagnosis not present

## 2016-01-08 DIAGNOSIS — Z79899 Other long term (current) drug therapy: Secondary | ICD-10-CM | POA: Diagnosis not present

## 2016-01-08 DIAGNOSIS — S0181XA Laceration without foreign body of other part of head, initial encounter: Secondary | ICD-10-CM | POA: Diagnosis not present

## 2016-01-08 DIAGNOSIS — S0990XA Unspecified injury of head, initial encounter: Secondary | ICD-10-CM | POA: Diagnosis not present

## 2016-01-08 DIAGNOSIS — Y9389 Activity, other specified: Secondary | ICD-10-CM | POA: Diagnosis not present

## 2016-01-08 DIAGNOSIS — S80819A Abrasion, unspecified lower leg, initial encounter: Secondary | ICD-10-CM | POA: Diagnosis not present

## 2016-01-08 DIAGNOSIS — W1830XA Fall on same level, unspecified, initial encounter: Secondary | ICD-10-CM | POA: Diagnosis not present

## 2016-01-08 DIAGNOSIS — R54 Age-related physical debility: Secondary | ICD-10-CM | POA: Diagnosis not present

## 2016-01-08 DIAGNOSIS — Z743 Need for continuous supervision: Secondary | ICD-10-CM | POA: Diagnosis not present

## 2016-01-08 DIAGNOSIS — S0031XA Abrasion of nose, initial encounter: Secondary | ICD-10-CM | POA: Diagnosis not present

## 2016-01-08 DIAGNOSIS — R402242 Coma scale, best verbal response, confused conversation, at arrival to emergency department: Secondary | ICD-10-CM | POA: Diagnosis not present

## 2016-01-08 DIAGNOSIS — Z23 Encounter for immunization: Secondary | ICD-10-CM | POA: Diagnosis not present

## 2016-01-08 DIAGNOSIS — S12491A Other nondisplaced fracture of fifth cervical vertebra, initial encounter for closed fracture: Secondary | ICD-10-CM | POA: Diagnosis not present

## 2016-01-08 DIAGNOSIS — F0390 Unspecified dementia without behavioral disturbance: Secondary | ICD-10-CM | POA: Diagnosis not present

## 2016-01-08 DIAGNOSIS — S0003XA Contusion of scalp, initial encounter: Secondary | ICD-10-CM | POA: Diagnosis not present

## 2016-01-08 DIAGNOSIS — N189 Chronic kidney disease, unspecified: Secondary | ICD-10-CM | POA: Diagnosis not present

## 2016-01-08 DIAGNOSIS — R402142 Coma scale, eyes open, spontaneous, at arrival to emergency department: Secondary | ICD-10-CM | POA: Diagnosis not present

## 2016-01-08 DIAGNOSIS — I119 Hypertensive heart disease without heart failure: Secondary | ICD-10-CM | POA: Diagnosis not present

## 2016-01-08 DIAGNOSIS — S199XXA Unspecified injury of neck, initial encounter: Secondary | ICD-10-CM | POA: Diagnosis not present

## 2016-01-08 DIAGNOSIS — I1310 Hypertensive heart and chronic kidney disease without heart failure, with stage 1 through stage 4 chronic kidney disease, or unspecified chronic kidney disease: Secondary | ICD-10-CM | POA: Diagnosis not present

## 2016-01-08 DIAGNOSIS — S80811A Abrasion, right lower leg, initial encounter: Secondary | ICD-10-CM | POA: Diagnosis not present

## 2016-01-09 DIAGNOSIS — W19XXXA Unspecified fall, initial encounter: Secondary | ICD-10-CM | POA: Diagnosis not present

## 2016-01-09 DIAGNOSIS — F0390 Unspecified dementia without behavioral disturbance: Secondary | ICD-10-CM | POA: Diagnosis not present

## 2016-01-09 DIAGNOSIS — R5381 Other malaise: Secondary | ICD-10-CM | POA: Diagnosis not present

## 2016-01-09 DIAGNOSIS — S0181XA Laceration without foreign body of other part of head, initial encounter: Secondary | ICD-10-CM | POA: Diagnosis not present

## 2016-01-09 DIAGNOSIS — R262 Difficulty in walking, not elsewhere classified: Secondary | ICD-10-CM | POA: Diagnosis not present

## 2016-01-09 DIAGNOSIS — S12491A Other nondisplaced fracture of fifth cervical vertebra, initial encounter for closed fracture: Secondary | ICD-10-CM | POA: Diagnosis not present

## 2016-01-09 DIAGNOSIS — S80819A Abrasion, unspecified lower leg, initial encounter: Secondary | ICD-10-CM | POA: Diagnosis not present

## 2016-01-09 DIAGNOSIS — S12490A Other displaced fracture of fifth cervical vertebra, initial encounter for closed fracture: Secondary | ICD-10-CM | POA: Diagnosis not present

## 2016-01-14 DIAGNOSIS — F0390 Unspecified dementia without behavioral disturbance: Secondary | ICD-10-CM | POA: Diagnosis not present

## 2016-01-14 DIAGNOSIS — S80811D Abrasion, right lower leg, subsequent encounter: Secondary | ICD-10-CM | POA: Diagnosis not present

## 2016-01-14 DIAGNOSIS — S0181XD Laceration without foreign body of other part of head, subsequent encounter: Secondary | ICD-10-CM | POA: Diagnosis not present

## 2016-01-14 DIAGNOSIS — Z4802 Encounter for removal of sutures: Secondary | ICD-10-CM | POA: Diagnosis not present

## 2016-01-14 DIAGNOSIS — I10 Essential (primary) hypertension: Secondary | ICD-10-CM | POA: Diagnosis not present

## 2016-01-17 DIAGNOSIS — D649 Anemia, unspecified: Secondary | ICD-10-CM | POA: Diagnosis not present

## 2016-01-17 DIAGNOSIS — I10 Essential (primary) hypertension: Secondary | ICD-10-CM | POA: Diagnosis not present

## 2016-01-17 DIAGNOSIS — R531 Weakness: Secondary | ICD-10-CM | POA: Diagnosis not present

## 2016-01-17 DIAGNOSIS — E860 Dehydration: Secondary | ICD-10-CM | POA: Diagnosis not present

## 2016-01-19 DIAGNOSIS — Z9181 History of falling: Secondary | ICD-10-CM | POA: Diagnosis not present

## 2016-01-19 DIAGNOSIS — R269 Unspecified abnormalities of gait and mobility: Secondary | ICD-10-CM | POA: Diagnosis not present

## 2016-01-19 DIAGNOSIS — F0390 Unspecified dementia without behavioral disturbance: Secondary | ICD-10-CM | POA: Diagnosis not present

## 2016-01-19 DIAGNOSIS — S12400D Unspecified displaced fracture of fifth cervical vertebra, subsequent encounter for fracture with routine healing: Secondary | ICD-10-CM | POA: Diagnosis not present

## 2016-01-19 DIAGNOSIS — I2510 Atherosclerotic heart disease of native coronary artery without angina pectoris: Secondary | ICD-10-CM | POA: Diagnosis not present

## 2016-01-19 DIAGNOSIS — R531 Weakness: Secondary | ICD-10-CM | POA: Diagnosis not present

## 2016-01-19 DIAGNOSIS — I10 Essential (primary) hypertension: Secondary | ICD-10-CM | POA: Diagnosis not present

## 2016-01-20 DIAGNOSIS — S129XXA Fracture of neck, unspecified, initial encounter: Secondary | ICD-10-CM | POA: Diagnosis not present

## 2016-01-22 DIAGNOSIS — F0390 Unspecified dementia without behavioral disturbance: Secondary | ICD-10-CM | POA: Diagnosis not present

## 2016-01-22 DIAGNOSIS — R269 Unspecified abnormalities of gait and mobility: Secondary | ICD-10-CM | POA: Diagnosis not present

## 2016-01-22 DIAGNOSIS — I2510 Atherosclerotic heart disease of native coronary artery without angina pectoris: Secondary | ICD-10-CM | POA: Diagnosis not present

## 2016-01-22 DIAGNOSIS — S12400D Unspecified displaced fracture of fifth cervical vertebra, subsequent encounter for fracture with routine healing: Secondary | ICD-10-CM | POA: Diagnosis not present

## 2016-01-22 DIAGNOSIS — I10 Essential (primary) hypertension: Secondary | ICD-10-CM | POA: Diagnosis not present

## 2016-01-22 DIAGNOSIS — Z9181 History of falling: Secondary | ICD-10-CM | POA: Diagnosis not present

## 2016-01-27 DIAGNOSIS — I2510 Atherosclerotic heart disease of native coronary artery without angina pectoris: Secondary | ICD-10-CM | POA: Diagnosis not present

## 2016-01-27 DIAGNOSIS — I10 Essential (primary) hypertension: Secondary | ICD-10-CM | POA: Diagnosis not present

## 2016-01-27 DIAGNOSIS — Z9181 History of falling: Secondary | ICD-10-CM | POA: Diagnosis not present

## 2016-01-27 DIAGNOSIS — S12400D Unspecified displaced fracture of fifth cervical vertebra, subsequent encounter for fracture with routine healing: Secondary | ICD-10-CM | POA: Diagnosis not present

## 2016-01-27 DIAGNOSIS — R269 Unspecified abnormalities of gait and mobility: Secondary | ICD-10-CM | POA: Diagnosis not present

## 2016-01-27 DIAGNOSIS — F0390 Unspecified dementia without behavioral disturbance: Secondary | ICD-10-CM | POA: Diagnosis not present

## 2016-01-29 DIAGNOSIS — I2510 Atherosclerotic heart disease of native coronary artery without angina pectoris: Secondary | ICD-10-CM | POA: Diagnosis not present

## 2016-01-29 DIAGNOSIS — I10 Essential (primary) hypertension: Secondary | ICD-10-CM | POA: Diagnosis not present

## 2016-01-29 DIAGNOSIS — Z9181 History of falling: Secondary | ICD-10-CM | POA: Diagnosis not present

## 2016-01-29 DIAGNOSIS — F0390 Unspecified dementia without behavioral disturbance: Secondary | ICD-10-CM | POA: Diagnosis not present

## 2016-01-29 DIAGNOSIS — R269 Unspecified abnormalities of gait and mobility: Secondary | ICD-10-CM | POA: Diagnosis not present

## 2016-01-29 DIAGNOSIS — S12400D Unspecified displaced fracture of fifth cervical vertebra, subsequent encounter for fracture with routine healing: Secondary | ICD-10-CM | POA: Diagnosis not present

## 2016-01-31 DIAGNOSIS — I2510 Atherosclerotic heart disease of native coronary artery without angina pectoris: Secondary | ICD-10-CM | POA: Diagnosis not present

## 2016-01-31 DIAGNOSIS — F0390 Unspecified dementia without behavioral disturbance: Secondary | ICD-10-CM | POA: Diagnosis not present

## 2016-01-31 DIAGNOSIS — I10 Essential (primary) hypertension: Secondary | ICD-10-CM | POA: Diagnosis not present

## 2016-01-31 DIAGNOSIS — S12400D Unspecified displaced fracture of fifth cervical vertebra, subsequent encounter for fracture with routine healing: Secondary | ICD-10-CM | POA: Diagnosis not present

## 2016-01-31 DIAGNOSIS — Z9181 History of falling: Secondary | ICD-10-CM | POA: Diagnosis not present

## 2016-01-31 DIAGNOSIS — R269 Unspecified abnormalities of gait and mobility: Secondary | ICD-10-CM | POA: Diagnosis not present

## 2016-02-04 DIAGNOSIS — S12400D Unspecified displaced fracture of fifth cervical vertebra, subsequent encounter for fracture with routine healing: Secondary | ICD-10-CM | POA: Diagnosis not present

## 2016-02-04 DIAGNOSIS — I10 Essential (primary) hypertension: Secondary | ICD-10-CM | POA: Diagnosis not present

## 2016-02-04 DIAGNOSIS — I2510 Atherosclerotic heart disease of native coronary artery without angina pectoris: Secondary | ICD-10-CM | POA: Diagnosis not present

## 2016-02-04 DIAGNOSIS — Z9181 History of falling: Secondary | ICD-10-CM | POA: Diagnosis not present

## 2016-02-04 DIAGNOSIS — R269 Unspecified abnormalities of gait and mobility: Secondary | ICD-10-CM | POA: Diagnosis not present

## 2016-02-04 DIAGNOSIS — F0390 Unspecified dementia without behavioral disturbance: Secondary | ICD-10-CM | POA: Diagnosis not present

## 2016-02-06 DIAGNOSIS — N390 Urinary tract infection, site not specified: Secondary | ICD-10-CM | POA: Diagnosis not present

## 2016-02-06 DIAGNOSIS — F0390 Unspecified dementia without behavioral disturbance: Secondary | ICD-10-CM | POA: Diagnosis not present

## 2016-02-06 DIAGNOSIS — Z8781 Personal history of (healed) traumatic fracture: Secondary | ICD-10-CM | POA: Diagnosis not present

## 2016-02-06 DIAGNOSIS — R42 Dizziness and giddiness: Secondary | ICD-10-CM | POA: Diagnosis not present

## 2016-02-06 DIAGNOSIS — I119 Hypertensive heart disease without heart failure: Secondary | ICD-10-CM | POA: Diagnosis not present

## 2016-02-07 DIAGNOSIS — R42 Dizziness and giddiness: Secondary | ICD-10-CM | POA: Diagnosis not present

## 2016-02-11 DIAGNOSIS — S12400D Unspecified displaced fracture of fifth cervical vertebra, subsequent encounter for fracture with routine healing: Secondary | ICD-10-CM | POA: Diagnosis not present

## 2016-02-11 DIAGNOSIS — R269 Unspecified abnormalities of gait and mobility: Secondary | ICD-10-CM | POA: Diagnosis not present

## 2016-02-11 DIAGNOSIS — I10 Essential (primary) hypertension: Secondary | ICD-10-CM | POA: Diagnosis not present

## 2016-02-11 DIAGNOSIS — Z9181 History of falling: Secondary | ICD-10-CM | POA: Diagnosis not present

## 2016-02-11 DIAGNOSIS — I2510 Atherosclerotic heart disease of native coronary artery without angina pectoris: Secondary | ICD-10-CM | POA: Diagnosis not present

## 2016-02-11 DIAGNOSIS — F0390 Unspecified dementia without behavioral disturbance: Secondary | ICD-10-CM | POA: Diagnosis not present

## 2016-02-13 DIAGNOSIS — B351 Tinea unguium: Secondary | ICD-10-CM | POA: Diagnosis not present

## 2016-02-13 DIAGNOSIS — I739 Peripheral vascular disease, unspecified: Secondary | ICD-10-CM | POA: Diagnosis not present

## 2016-02-14 DIAGNOSIS — R269 Unspecified abnormalities of gait and mobility: Secondary | ICD-10-CM | POA: Diagnosis not present

## 2016-02-14 DIAGNOSIS — I2510 Atherosclerotic heart disease of native coronary artery without angina pectoris: Secondary | ICD-10-CM | POA: Diagnosis not present

## 2016-02-14 DIAGNOSIS — I10 Essential (primary) hypertension: Secondary | ICD-10-CM | POA: Diagnosis not present

## 2016-02-14 DIAGNOSIS — F0390 Unspecified dementia without behavioral disturbance: Secondary | ICD-10-CM | POA: Diagnosis not present

## 2016-02-14 DIAGNOSIS — Z9181 History of falling: Secondary | ICD-10-CM | POA: Diagnosis not present

## 2016-02-14 DIAGNOSIS — S12400D Unspecified displaced fracture of fifth cervical vertebra, subsequent encounter for fracture with routine healing: Secondary | ICD-10-CM | POA: Diagnosis not present

## 2016-02-17 DIAGNOSIS — A419 Sepsis, unspecified organism: Secondary | ICD-10-CM | POA: Diagnosis not present

## 2016-02-17 DIAGNOSIS — S129XXA Fracture of neck, unspecified, initial encounter: Secondary | ICD-10-CM | POA: Diagnosis not present

## 2016-02-17 DIAGNOSIS — F0390 Unspecified dementia without behavioral disturbance: Secondary | ICD-10-CM | POA: Diagnosis not present

## 2016-02-17 DIAGNOSIS — R1310 Dysphagia, unspecified: Secondary | ICD-10-CM | POA: Diagnosis not present

## 2016-02-17 DIAGNOSIS — E041 Nontoxic single thyroid nodule: Secondary | ICD-10-CM | POA: Diagnosis not present

## 2016-02-18 DIAGNOSIS — S12400D Unspecified displaced fracture of fifth cervical vertebra, subsequent encounter for fracture with routine healing: Secondary | ICD-10-CM | POA: Diagnosis not present

## 2016-02-18 DIAGNOSIS — I2510 Atherosclerotic heart disease of native coronary artery without angina pectoris: Secondary | ICD-10-CM | POA: Diagnosis not present

## 2016-02-18 DIAGNOSIS — Z9181 History of falling: Secondary | ICD-10-CM | POA: Diagnosis not present

## 2016-02-18 DIAGNOSIS — F0390 Unspecified dementia without behavioral disturbance: Secondary | ICD-10-CM | POA: Diagnosis not present

## 2016-02-18 DIAGNOSIS — I10 Essential (primary) hypertension: Secondary | ICD-10-CM | POA: Diagnosis not present

## 2016-02-18 DIAGNOSIS — R269 Unspecified abnormalities of gait and mobility: Secondary | ICD-10-CM | POA: Diagnosis not present

## 2016-02-20 DIAGNOSIS — I10 Essential (primary) hypertension: Secondary | ICD-10-CM | POA: Diagnosis not present

## 2016-02-20 DIAGNOSIS — Z9181 History of falling: Secondary | ICD-10-CM | POA: Diagnosis not present

## 2016-02-20 DIAGNOSIS — I2510 Atherosclerotic heart disease of native coronary artery without angina pectoris: Secondary | ICD-10-CM | POA: Diagnosis not present

## 2016-02-20 DIAGNOSIS — F0390 Unspecified dementia without behavioral disturbance: Secondary | ICD-10-CM | POA: Diagnosis not present

## 2016-02-20 DIAGNOSIS — S12400D Unspecified displaced fracture of fifth cervical vertebra, subsequent encounter for fracture with routine healing: Secondary | ICD-10-CM | POA: Diagnosis not present

## 2016-02-20 DIAGNOSIS — R269 Unspecified abnormalities of gait and mobility: Secondary | ICD-10-CM | POA: Diagnosis not present

## 2016-02-20 DIAGNOSIS — S129XXA Fracture of neck, unspecified, initial encounter: Secondary | ICD-10-CM | POA: Diagnosis not present

## 2016-02-25 DIAGNOSIS — I2510 Atherosclerotic heart disease of native coronary artery without angina pectoris: Secondary | ICD-10-CM | POA: Diagnosis not present

## 2016-02-25 DIAGNOSIS — I10 Essential (primary) hypertension: Secondary | ICD-10-CM | POA: Diagnosis not present

## 2016-02-25 DIAGNOSIS — Z9181 History of falling: Secondary | ICD-10-CM | POA: Diagnosis not present

## 2016-02-25 DIAGNOSIS — R269 Unspecified abnormalities of gait and mobility: Secondary | ICD-10-CM | POA: Diagnosis not present

## 2016-02-25 DIAGNOSIS — F0390 Unspecified dementia without behavioral disturbance: Secondary | ICD-10-CM | POA: Diagnosis not present

## 2016-02-25 DIAGNOSIS — S12400D Unspecified displaced fracture of fifth cervical vertebra, subsequent encounter for fracture with routine healing: Secondary | ICD-10-CM | POA: Diagnosis not present

## 2016-02-26 DIAGNOSIS — I2510 Atherosclerotic heart disease of native coronary artery without angina pectoris: Secondary | ICD-10-CM | POA: Diagnosis not present

## 2016-02-26 DIAGNOSIS — S12400D Unspecified displaced fracture of fifth cervical vertebra, subsequent encounter for fracture with routine healing: Secondary | ICD-10-CM | POA: Diagnosis not present

## 2016-02-26 DIAGNOSIS — R269 Unspecified abnormalities of gait and mobility: Secondary | ICD-10-CM | POA: Diagnosis not present

## 2016-02-26 DIAGNOSIS — I10 Essential (primary) hypertension: Secondary | ICD-10-CM | POA: Diagnosis not present

## 2016-02-26 DIAGNOSIS — Z9181 History of falling: Secondary | ICD-10-CM | POA: Diagnosis not present

## 2016-02-26 DIAGNOSIS — F0390 Unspecified dementia without behavioral disturbance: Secondary | ICD-10-CM | POA: Diagnosis not present

## 2016-02-27 DIAGNOSIS — R269 Unspecified abnormalities of gait and mobility: Secondary | ICD-10-CM | POA: Diagnosis not present

## 2016-02-27 DIAGNOSIS — Z9181 History of falling: Secondary | ICD-10-CM | POA: Diagnosis not present

## 2016-02-27 DIAGNOSIS — I10 Essential (primary) hypertension: Secondary | ICD-10-CM | POA: Diagnosis not present

## 2016-02-27 DIAGNOSIS — I952 Hypotension due to drugs: Secondary | ICD-10-CM | POA: Diagnosis not present

## 2016-02-27 DIAGNOSIS — S12400D Unspecified displaced fracture of fifth cervical vertebra, subsequent encounter for fracture with routine healing: Secondary | ICD-10-CM | POA: Diagnosis not present

## 2016-02-27 DIAGNOSIS — I2510 Atherosclerotic heart disease of native coronary artery without angina pectoris: Secondary | ICD-10-CM | POA: Diagnosis not present

## 2016-02-27 DIAGNOSIS — N184 Chronic kidney disease, stage 4 (severe): Secondary | ICD-10-CM | POA: Diagnosis not present

## 2016-02-27 DIAGNOSIS — F0390 Unspecified dementia without behavioral disturbance: Secondary | ICD-10-CM | POA: Diagnosis not present

## 2016-03-02 DIAGNOSIS — I10 Essential (primary) hypertension: Secondary | ICD-10-CM | POA: Diagnosis not present

## 2016-03-02 DIAGNOSIS — Z9181 History of falling: Secondary | ICD-10-CM | POA: Diagnosis not present

## 2016-03-02 DIAGNOSIS — I2510 Atherosclerotic heart disease of native coronary artery without angina pectoris: Secondary | ICD-10-CM | POA: Diagnosis not present

## 2016-03-02 DIAGNOSIS — F0390 Unspecified dementia without behavioral disturbance: Secondary | ICD-10-CM | POA: Diagnosis not present

## 2016-03-02 DIAGNOSIS — R269 Unspecified abnormalities of gait and mobility: Secondary | ICD-10-CM | POA: Diagnosis not present

## 2016-03-02 DIAGNOSIS — S12400D Unspecified displaced fracture of fifth cervical vertebra, subsequent encounter for fracture with routine healing: Secondary | ICD-10-CM | POA: Diagnosis not present

## 2016-03-04 DIAGNOSIS — F0390 Unspecified dementia without behavioral disturbance: Secondary | ICD-10-CM | POA: Diagnosis not present

## 2016-03-04 DIAGNOSIS — Z9181 History of falling: Secondary | ICD-10-CM | POA: Diagnosis not present

## 2016-03-04 DIAGNOSIS — S12400D Unspecified displaced fracture of fifth cervical vertebra, subsequent encounter for fracture with routine healing: Secondary | ICD-10-CM | POA: Diagnosis not present

## 2016-03-04 DIAGNOSIS — R269 Unspecified abnormalities of gait and mobility: Secondary | ICD-10-CM | POA: Diagnosis not present

## 2016-03-04 DIAGNOSIS — I10 Essential (primary) hypertension: Secondary | ICD-10-CM | POA: Diagnosis not present

## 2016-03-04 DIAGNOSIS — I2510 Atherosclerotic heart disease of native coronary artery without angina pectoris: Secondary | ICD-10-CM | POA: Diagnosis not present

## 2016-03-05 DIAGNOSIS — R269 Unspecified abnormalities of gait and mobility: Secondary | ICD-10-CM | POA: Diagnosis not present

## 2016-03-05 DIAGNOSIS — I2510 Atherosclerotic heart disease of native coronary artery without angina pectoris: Secondary | ICD-10-CM | POA: Diagnosis not present

## 2016-03-05 DIAGNOSIS — F0390 Unspecified dementia without behavioral disturbance: Secondary | ICD-10-CM | POA: Diagnosis not present

## 2016-03-05 DIAGNOSIS — I10 Essential (primary) hypertension: Secondary | ICD-10-CM | POA: Diagnosis not present

## 2016-03-05 DIAGNOSIS — Z9181 History of falling: Secondary | ICD-10-CM | POA: Diagnosis not present

## 2016-03-05 DIAGNOSIS — S12400D Unspecified displaced fracture of fifth cervical vertebra, subsequent encounter for fracture with routine healing: Secondary | ICD-10-CM | POA: Diagnosis not present

## 2016-03-06 DIAGNOSIS — I2510 Atherosclerotic heart disease of native coronary artery without angina pectoris: Secondary | ICD-10-CM | POA: Diagnosis not present

## 2016-03-06 DIAGNOSIS — F0390 Unspecified dementia without behavioral disturbance: Secondary | ICD-10-CM | POA: Diagnosis not present

## 2016-03-06 DIAGNOSIS — Z9181 History of falling: Secondary | ICD-10-CM | POA: Diagnosis not present

## 2016-03-06 DIAGNOSIS — S12400D Unspecified displaced fracture of fifth cervical vertebra, subsequent encounter for fracture with routine healing: Secondary | ICD-10-CM | POA: Diagnosis not present

## 2016-03-06 DIAGNOSIS — R269 Unspecified abnormalities of gait and mobility: Secondary | ICD-10-CM | POA: Diagnosis not present

## 2016-03-06 DIAGNOSIS — I10 Essential (primary) hypertension: Secondary | ICD-10-CM | POA: Diagnosis not present

## 2016-03-09 DIAGNOSIS — Z9181 History of falling: Secondary | ICD-10-CM | POA: Diagnosis not present

## 2016-03-09 DIAGNOSIS — F0390 Unspecified dementia without behavioral disturbance: Secondary | ICD-10-CM | POA: Diagnosis not present

## 2016-03-09 DIAGNOSIS — R269 Unspecified abnormalities of gait and mobility: Secondary | ICD-10-CM | POA: Diagnosis not present

## 2016-03-09 DIAGNOSIS — I10 Essential (primary) hypertension: Secondary | ICD-10-CM | POA: Diagnosis not present

## 2016-03-09 DIAGNOSIS — S12400D Unspecified displaced fracture of fifth cervical vertebra, subsequent encounter for fracture with routine healing: Secondary | ICD-10-CM | POA: Diagnosis not present

## 2016-03-09 DIAGNOSIS — I2510 Atherosclerotic heart disease of native coronary artery without angina pectoris: Secondary | ICD-10-CM | POA: Diagnosis not present

## 2016-03-10 DIAGNOSIS — S12400D Unspecified displaced fracture of fifth cervical vertebra, subsequent encounter for fracture with routine healing: Secondary | ICD-10-CM | POA: Diagnosis not present

## 2016-03-10 DIAGNOSIS — F0390 Unspecified dementia without behavioral disturbance: Secondary | ICD-10-CM | POA: Diagnosis not present

## 2016-03-10 DIAGNOSIS — I10 Essential (primary) hypertension: Secondary | ICD-10-CM | POA: Diagnosis not present

## 2016-03-10 DIAGNOSIS — I2510 Atherosclerotic heart disease of native coronary artery without angina pectoris: Secondary | ICD-10-CM | POA: Diagnosis not present

## 2016-03-10 DIAGNOSIS — Z9181 History of falling: Secondary | ICD-10-CM | POA: Diagnosis not present

## 2016-03-10 DIAGNOSIS — R269 Unspecified abnormalities of gait and mobility: Secondary | ICD-10-CM | POA: Diagnosis not present

## 2016-03-10 DIAGNOSIS — N184 Chronic kidney disease, stage 4 (severe): Secondary | ICD-10-CM | POA: Diagnosis not present

## 2016-03-13 DIAGNOSIS — I10 Essential (primary) hypertension: Secondary | ICD-10-CM | POA: Diagnosis not present

## 2016-03-13 DIAGNOSIS — Z9181 History of falling: Secondary | ICD-10-CM | POA: Diagnosis not present

## 2016-03-13 DIAGNOSIS — R269 Unspecified abnormalities of gait and mobility: Secondary | ICD-10-CM | POA: Diagnosis not present

## 2016-03-13 DIAGNOSIS — S12400D Unspecified displaced fracture of fifth cervical vertebra, subsequent encounter for fracture with routine healing: Secondary | ICD-10-CM | POA: Diagnosis not present

## 2016-03-13 DIAGNOSIS — I2510 Atherosclerotic heart disease of native coronary artery without angina pectoris: Secondary | ICD-10-CM | POA: Diagnosis not present

## 2016-03-13 DIAGNOSIS — F0390 Unspecified dementia without behavioral disturbance: Secondary | ICD-10-CM | POA: Diagnosis not present

## 2016-03-18 DIAGNOSIS — Z9181 History of falling: Secondary | ICD-10-CM | POA: Diagnosis not present

## 2016-03-18 DIAGNOSIS — I2510 Atherosclerotic heart disease of native coronary artery without angina pectoris: Secondary | ICD-10-CM | POA: Diagnosis not present

## 2016-03-18 DIAGNOSIS — F0390 Unspecified dementia without behavioral disturbance: Secondary | ICD-10-CM | POA: Diagnosis not present

## 2016-03-18 DIAGNOSIS — I10 Essential (primary) hypertension: Secondary | ICD-10-CM | POA: Diagnosis not present

## 2016-03-18 DIAGNOSIS — S12400D Unspecified displaced fracture of fifth cervical vertebra, subsequent encounter for fracture with routine healing: Secondary | ICD-10-CM | POA: Diagnosis not present

## 2016-03-18 DIAGNOSIS — R269 Unspecified abnormalities of gait and mobility: Secondary | ICD-10-CM | POA: Diagnosis not present

## 2016-04-03 DIAGNOSIS — E041 Nontoxic single thyroid nodule: Secondary | ICD-10-CM | POA: Diagnosis not present

## 2016-04-06 DIAGNOSIS — Z23 Encounter for immunization: Secondary | ICD-10-CM | POA: Diagnosis not present

## 2016-04-09 DIAGNOSIS — E041 Nontoxic single thyroid nodule: Secondary | ICD-10-CM | POA: Diagnosis not present

## 2016-04-09 DIAGNOSIS — I10 Essential (primary) hypertension: Secondary | ICD-10-CM | POA: Diagnosis not present

## 2016-04-13 DIAGNOSIS — E041 Nontoxic single thyroid nodule: Secondary | ICD-10-CM | POA: Diagnosis not present

## 2016-04-13 DIAGNOSIS — E042 Nontoxic multinodular goiter: Secondary | ICD-10-CM | POA: Diagnosis not present

## 2016-04-14 DIAGNOSIS — E041 Nontoxic single thyroid nodule: Secondary | ICD-10-CM | POA: Diagnosis not present

## 2016-04-14 DIAGNOSIS — N184 Chronic kidney disease, stage 4 (severe): Secondary | ICD-10-CM | POA: Diagnosis not present

## 2016-04-14 DIAGNOSIS — I10 Essential (primary) hypertension: Secondary | ICD-10-CM | POA: Diagnosis not present

## 2016-04-14 DIAGNOSIS — F0390 Unspecified dementia without behavioral disturbance: Secondary | ICD-10-CM | POA: Diagnosis not present

## 2016-04-17 DIAGNOSIS — I739 Peripheral vascular disease, unspecified: Secondary | ICD-10-CM | POA: Diagnosis not present

## 2016-04-17 DIAGNOSIS — B351 Tinea unguium: Secondary | ICD-10-CM | POA: Diagnosis not present

## 2016-05-11 DIAGNOSIS — I10 Essential (primary) hypertension: Secondary | ICD-10-CM | POA: Diagnosis not present

## 2016-05-11 DIAGNOSIS — J069 Acute upper respiratory infection, unspecified: Secondary | ICD-10-CM | POA: Diagnosis not present

## 2016-05-18 DIAGNOSIS — L600 Ingrowing nail: Secondary | ICD-10-CM | POA: Diagnosis not present

## 2016-05-18 DIAGNOSIS — M79674 Pain in right toe(s): Secondary | ICD-10-CM | POA: Diagnosis not present

## 2016-06-12 DIAGNOSIS — H04123 Dry eye syndrome of bilateral lacrimal glands: Secondary | ICD-10-CM | POA: Diagnosis not present

## 2016-06-12 DIAGNOSIS — H43813 Vitreous degeneration, bilateral: Secondary | ICD-10-CM | POA: Diagnosis not present

## 2016-06-12 DIAGNOSIS — H52223 Regular astigmatism, bilateral: Secondary | ICD-10-CM | POA: Diagnosis not present

## 2016-06-12 DIAGNOSIS — H353131 Nonexudative age-related macular degeneration, bilateral, early dry stage: Secondary | ICD-10-CM | POA: Diagnosis not present

## 2016-06-12 DIAGNOSIS — H524 Presbyopia: Secondary | ICD-10-CM | POA: Diagnosis not present

## 2016-06-16 DIAGNOSIS — G309 Alzheimer's disease, unspecified: Secondary | ICD-10-CM | POA: Diagnosis not present

## 2016-06-16 DIAGNOSIS — I129 Hypertensive chronic kidney disease with stage 1 through stage 4 chronic kidney disease, or unspecified chronic kidney disease: Secondary | ICD-10-CM | POA: Diagnosis not present

## 2016-06-16 DIAGNOSIS — E7800 Pure hypercholesterolemia, unspecified: Secondary | ICD-10-CM | POA: Diagnosis not present

## 2016-06-16 DIAGNOSIS — K219 Gastro-esophageal reflux disease without esophagitis: Secondary | ICD-10-CM | POA: Diagnosis not present

## 2016-06-16 DIAGNOSIS — E041 Nontoxic single thyroid nodule: Secondary | ICD-10-CM | POA: Diagnosis not present

## 2016-06-16 DIAGNOSIS — D631 Anemia in chronic kidney disease: Secondary | ICD-10-CM | POA: Diagnosis not present

## 2016-06-16 DIAGNOSIS — N184 Chronic kidney disease, stage 4 (severe): Secondary | ICD-10-CM | POA: Diagnosis not present

## 2016-06-23 ENCOUNTER — Ambulatory Visit (INDEPENDENT_AMBULATORY_CARE_PROVIDER_SITE_OTHER): Payer: Medicare Other | Admitting: Podiatry

## 2016-06-23 DIAGNOSIS — M79676 Pain in unspecified toe(s): Secondary | ICD-10-CM | POA: Diagnosis not present

## 2016-06-23 DIAGNOSIS — B351 Tinea unguium: Secondary | ICD-10-CM

## 2016-06-23 NOTE — Progress Notes (Signed)
Patient ID: Pamela Woods, female   DOB: 1922/04/29, 82 y.o.   MRN: [id number] Complaint:  Visit Type: Patient returns to my office for continued preventative foot care services. Complaint: Patient states" my nails have grown long and thick and become painful to walk and wear shoes" . The patient presents for preventative foot care services. No changes to ROS.  She admits to throbbing pain in both big toes.  Podiatric Exam: Vascular: dorsalis pedis and posterior tibial pulses are palpable bilateral. Capillary return is immediate. Temperature gradient is WNL. Skin turgor WNL  Sensorium: Normal Semmes Weinstein monofilament test. Normal tactile sensation bilaterally. Nail Exam: Pt has thick disfigured discolored nails with subungual debris noted bilateral entire nail hallux through fifth toenails Ulcer Exam: There is no evidence of ulcer or pre-ulcerative changes or infection. Orthopedic Exam: Muscle tone and strength are WNL. No limitations in general ROM. No crepitus or effusions noted. Foot type and digits show no abnormalities. Bony prominences are unremarkable. Skin: No Porokeratosis. No infection or ulcers  Diagnosis:  Onychomycosis, , Pain in right toe, pain in left toes  Treatment & Plan Procedures and Treatment: Consent by patient was obtained for treatment procedures. The patient understood the discussion of treatment and procedures well. All questions were answered thoroughly reviewed. Debridement of mycotic and hypertrophic toenails, 1 through 5 bilateral and clearing of subungual debris. No ulceration, no infection noted.  Return Visit-Office Procedure: Patient instructed to return to the office for a follow up visit 3 months for continued evaluation and treatment.    Gardiner Barefoot DPM

## 2016-08-17 DIAGNOSIS — E7800 Pure hypercholesterolemia, unspecified: Secondary | ICD-10-CM | POA: Diagnosis not present

## 2016-08-25 ENCOUNTER — Ambulatory Visit (INDEPENDENT_AMBULATORY_CARE_PROVIDER_SITE_OTHER): Payer: Medicare Other | Admitting: Podiatry

## 2016-08-25 ENCOUNTER — Encounter: Payer: Self-pay | Admitting: Podiatry

## 2016-08-25 VITALS — Ht 62.0 in | Wt 112.0 lb

## 2016-08-25 DIAGNOSIS — B351 Tinea unguium: Secondary | ICD-10-CM | POA: Diagnosis not present

## 2016-08-25 DIAGNOSIS — M79676 Pain in unspecified toe(s): Secondary | ICD-10-CM

## 2016-08-25 NOTE — Progress Notes (Signed)
Patient ID: Pamela Woods, female   DOB: 1922/01/12, 83 y.o.   MRN: [id number] Complaint:  Visit Type: Patient returns to my office for continued preventative foot care services. Complaint: Patient states" my nails have grown long and thick and become painful to walk and wear shoes" . The patient presents for preventative foot care services. No changes to ROS.  She admits to throbbing pain in both big toes.  Podiatric Exam: Vascular: dorsalis pedis and posterior tibial pulses are palpable bilateral. Capillary return is immediate. Temperature gradient is WNL. Skin turgor WNL  Sensorium: Normal Semmes Weinstein monofilament test. Normal tactile sensation bilaterally. Nail Exam: Pt has thick disfigured discolored nails with subungual debris noted bilateral entire nail hallux through fifth toenails Ulcer Exam: There is no evidence of ulcer or pre-ulcerative changes or infection. Orthopedic Exam: Muscle tone and strength are WNL. No limitations in general ROM. No crepitus or effusions noted. Foot type and digits show no abnormalities. Bony prominences are unremarkable. Skin: No Porokeratosis. No infection or ulcers  Diagnosis:  Onychomycosis, , Pain in right toe, pain in left toes  Treatment & Plan Procedures and Treatment: Consent by patient was obtained for treatment procedures. The patient understood the discussion of treatment and procedures well. All questions were answered thoroughly reviewed. Debridement of mycotic and hypertrophic toenails, 1 through 5 bilateral and clearing of subungual debris. No ulceration, no infection noted.  Return Visit-Office Procedure: Patient instructed to return to the office for a follow up visit 9 weeks  for continued evaluation and treatment.    Gardiner Barefoot DPM

## 2016-10-27 ENCOUNTER — Ambulatory Visit (INDEPENDENT_AMBULATORY_CARE_PROVIDER_SITE_OTHER): Payer: Medicare Other | Admitting: Podiatry

## 2016-10-27 DIAGNOSIS — B351 Tinea unguium: Secondary | ICD-10-CM | POA: Diagnosis not present

## 2016-10-27 DIAGNOSIS — M79676 Pain in unspecified toe(s): Secondary | ICD-10-CM

## 2016-10-27 NOTE — Progress Notes (Signed)
Patient ID: Pamela Woods, female   DOB: 26-Dec-1921, 83 y.o.   MRN: [id number] Complaint:  Visit Type: Patient returns to my office for continued preventative foot care services. Complaint: Patient states" my nails have grown long and thick and become painful to walk and wear shoes" . The patient presents for preventative foot care services. No changes to ROS.  She admits to throbbing pain in right toe inside border.  Podiatric Exam: Vascular: dorsalis pedis and posterior tibial pulses are palpable bilateral. Capillary return is immediate. Temperature gradient is WNL. Skin turgor WNL  Sensorium: Normal Semmes Weinstein monofilament test. Normal tactile sensation bilaterally. Nail Exam: Pt has thick disfigured discolored nails with subungual debris noted bilateral entire nail hallux through fifth toenails Ulcer Exam: There is no evidence of ulcer or pre-ulcerative changes or infection. Orthopedic Exam: Muscle tone and strength are WNL. No limitations in general ROM. No crepitus or effusions noted. Foot type and digits show no abnormalities. Bony prominences are unremarkable. Skin: No Porokeratosis. No infection or ulcers  Diagnosis:  Onychomycosis, , Pain in right toe, pain in left toes  Treatment & Plan Procedures and Treatment: Consent by patient was obtained for treatment procedures. The patient understood the discussion of treatment and procedures well. All questions were answered thoroughly reviewed. Debridement of mycotic and hypertrophic toenails, 1 through 5 bilateral and clearing of subungual debris. No ulceration, no infection noted.  Return Visit-Office Procedure: Patient instructed to return to the office for a follow up visit 9 weeks  for continued evaluation and treatment.    Gardiner Barefoot DPM

## 2016-11-20 DIAGNOSIS — Z1231 Encounter for screening mammogram for malignant neoplasm of breast: Secondary | ICD-10-CM | POA: Diagnosis not present

## 2016-12-02 DIAGNOSIS — C678 Malignant neoplasm of overlapping sites of bladder: Secondary | ICD-10-CM | POA: Diagnosis not present

## 2016-12-09 DIAGNOSIS — I129 Hypertensive chronic kidney disease with stage 1 through stage 4 chronic kidney disease, or unspecified chronic kidney disease: Secondary | ICD-10-CM | POA: Diagnosis not present

## 2016-12-09 DIAGNOSIS — Z0000 Encounter for general adult medical examination without abnormal findings: Secondary | ICD-10-CM | POA: Diagnosis not present

## 2016-12-09 DIAGNOSIS — Z8551 Personal history of malignant neoplasm of bladder: Secondary | ICD-10-CM | POA: Diagnosis not present

## 2016-12-09 DIAGNOSIS — G309 Alzheimer's disease, unspecified: Secondary | ICD-10-CM | POA: Diagnosis not present

## 2016-12-09 DIAGNOSIS — M8580 Other specified disorders of bone density and structure, unspecified site: Secondary | ICD-10-CM | POA: Diagnosis not present

## 2016-12-09 DIAGNOSIS — K219 Gastro-esophageal reflux disease without esophagitis: Secondary | ICD-10-CM | POA: Diagnosis not present

## 2016-12-09 DIAGNOSIS — N184 Chronic kidney disease, stage 4 (severe): Secondary | ICD-10-CM | POA: Diagnosis not present

## 2016-12-09 DIAGNOSIS — E7800 Pure hypercholesterolemia, unspecified: Secondary | ICD-10-CM | POA: Diagnosis not present

## 2016-12-29 ENCOUNTER — Encounter: Payer: Self-pay | Admitting: Podiatry

## 2016-12-29 ENCOUNTER — Ambulatory Visit (INDEPENDENT_AMBULATORY_CARE_PROVIDER_SITE_OTHER): Payer: Medicare Other | Admitting: Podiatry

## 2016-12-29 DIAGNOSIS — B351 Tinea unguium: Secondary | ICD-10-CM | POA: Diagnosis not present

## 2016-12-29 DIAGNOSIS — M79676 Pain in unspecified toe(s): Secondary | ICD-10-CM | POA: Diagnosis not present

## 2016-12-29 NOTE — Progress Notes (Signed)
Patient ID: Pamela Woods, female   DOB: 05-Oct-1921, 83 y.o.   MRN: [id number] Complaint:  Visit Type: Patient returns to my office for continued preventative foot care services. Complaint: Patient states" my nails have grown long and thick and become painful to walk and wear shoes" . The patient presents for preventative foot care services. No changes to ROS.  She admits to throbbing pain in right toe inside border.  Podiatric Exam: Vascular: dorsalis pedis and posterior tibial pulses are palpable bilateral. Capillary return is immediate. Temperature gradient is WNL. Skin turgor WNL  Sensorium: Normal Semmes Weinstein monofilament test. Normal tactile sensation bilaterally. Nail Exam: Pt has thick disfigured discolored nails with subungual debris noted bilateral entire nail hallux through fifth toenails Ulcer Exam: There is no evidence of ulcer or pre-ulcerative changes or infection. Orthopedic Exam: Muscle tone and strength are WNL. No limitations in general ROM. No crepitus or effusions noted. Foot type and digits show no abnormalities. Bony prominences are unremarkable. Skin: No Porokeratosis. No infection or ulcers  Diagnosis:  Onychomycosis, , Pain in right toe, pain in left toes  Treatment & Plan Procedures and Treatment: Consent by patient was obtained for treatment procedures. The patient understood the discussion of treatment and procedures well. All questions were answered thoroughly reviewed. Debridement of mycotic and hypertrophic toenails, 1 through 5 bilateral and clearing of subungual debris. No ulceration, no infection noted.  Return Visit-Office Procedure: Patient instructed to return to the office for a follow up visit 9 weeks  for continued evaluation and treatment.    Gardiner Barefoot DPM

## 2017-03-04 DIAGNOSIS — I739 Peripheral vascular disease, unspecified: Secondary | ICD-10-CM | POA: Diagnosis not present

## 2017-03-04 DIAGNOSIS — B351 Tinea unguium: Secondary | ICD-10-CM | POA: Diagnosis not present

## 2017-03-09 ENCOUNTER — Ambulatory Visit: Payer: Medicare Other | Admitting: Podiatry

## 2017-03-15 DIAGNOSIS — R410 Disorientation, unspecified: Secondary | ICD-10-CM | POA: Diagnosis not present

## 2017-03-15 DIAGNOSIS — I10 Essential (primary) hypertension: Secondary | ICD-10-CM | POA: Diagnosis not present

## 2017-03-15 DIAGNOSIS — F0280 Dementia in other diseases classified elsewhere without behavioral disturbance: Secondary | ICD-10-CM | POA: Diagnosis not present

## 2017-03-15 DIAGNOSIS — G301 Alzheimer's disease with late onset: Secondary | ICD-10-CM | POA: Diagnosis not present

## 2017-03-15 DIAGNOSIS — F341 Dysthymic disorder: Secondary | ICD-10-CM | POA: Diagnosis not present

## 2017-03-19 DIAGNOSIS — R410 Disorientation, unspecified: Secondary | ICD-10-CM | POA: Diagnosis not present

## 2017-03-23 DIAGNOSIS — R112 Nausea with vomiting, unspecified: Secondary | ICD-10-CM | POA: Diagnosis not present

## 2017-03-23 DIAGNOSIS — F0390 Unspecified dementia without behavioral disturbance: Secondary | ICD-10-CM | POA: Diagnosis not present

## 2017-04-02 ENCOUNTER — Emergency Department (HOSPITAL_BASED_OUTPATIENT_CLINIC_OR_DEPARTMENT_OTHER)
Admission: EM | Admit: 2017-04-02 | Discharge: 2017-04-02 | Disposition: A | Discharge status: Home / Self Care | Payer: Medicare Other | Attending: Emergency Medicine | Admitting: Emergency Medicine

## 2017-04-02 ENCOUNTER — Encounter (HOSPITAL_BASED_OUTPATIENT_CLINIC_OR_DEPARTMENT_OTHER): Payer: Self-pay | Admitting: *Deleted

## 2017-04-02 ENCOUNTER — Emergency Department (HOSPITAL_BASED_OUTPATIENT_CLINIC_OR_DEPARTMENT_OTHER): Payer: Medicare Other

## 2017-04-02 DIAGNOSIS — I10 Essential (primary) hypertension: Secondary | ICD-10-CM | POA: Insufficient documentation

## 2017-04-02 DIAGNOSIS — E860 Dehydration: Secondary | ICD-10-CM | POA: Insufficient documentation

## 2017-04-02 DIAGNOSIS — R112 Nausea with vomiting, unspecified: Secondary | ICD-10-CM | POA: Diagnosis not present

## 2017-04-02 DIAGNOSIS — Z79899 Other long term (current) drug therapy: Secondary | ICD-10-CM | POA: Diagnosis not present

## 2017-04-02 DIAGNOSIS — R1110 Vomiting, unspecified: Secondary | ICD-10-CM | POA: Diagnosis not present

## 2017-04-02 LAB — URINALYSIS, ROUTINE W REFLEX MICROSCOPIC
BILIRUBIN URINE: NEGATIVE
Glucose, UA: NEGATIVE mg/dL
HGB URINE DIPSTICK: NEGATIVE
Ketones, ur: NEGATIVE mg/dL
Leukocytes, UA: NEGATIVE
NITRITE: NEGATIVE
PROTEIN: NEGATIVE mg/dL
Specific Gravity, Urine: <1.005 — ABNORMAL LOW (ref 1.005–1.030)
pH: 6 (ref 5.0–8.0)

## 2017-04-02 LAB — COMPREHENSIVE METABOLIC PANEL
ALK PHOS: 62 U/L (ref 38–126)
ALT: 11 U/L — AB (ref 14–54)
ANION GAP: 12 (ref 5–15)
AST: 18 U/L (ref 15–41)
Albumin: 4.2 g/dL (ref 3.5–5.0)
BILIRUBIN TOTAL: 1.3 mg/dL — AB (ref 0.3–1.2)
BUN: 30 mg/dL — ABNORMAL HIGH (ref 6–20)
CALCIUM: 9.7 mg/dL (ref 8.9–10.3)
CO2: 25 mmol/L (ref 22–32)
CREATININE: 1.64 mg/dL — AB (ref 0.44–1.00)
Chloride: 100 mmol/L — ABNORMAL LOW (ref 101–111)
GFR calc non Af Amer: 26 mL/min — ABNORMAL LOW (ref >60)
GFR, EST AFRICAN AMERICAN: 30 mL/min — AB (ref >60)
GLUCOSE: 160 mg/dL — AB (ref 65–99)
Potassium: 3.6 mmol/L (ref 3.5–5.1)
SODIUM: 137 mmol/L (ref 135–145)
TOTAL PROTEIN: 7.4 g/dL (ref 6.5–8.1)

## 2017-04-02 LAB — CBC
HCT: 33.9 % — ABNORMAL LOW (ref 36.0–46.0)
Hemoglobin: 10.9 g/dL — ABNORMAL LOW (ref 12.0–15.0)
MCH: 29.1 pg (ref 26.0–34.0)
MCHC: 32.2 g/dL (ref 30.0–36.0)
MCV: 90.6 fL (ref 78.0–100.0)
PLATELETS: 155 10*3/uL (ref 150–400)
RBC: 3.74 MIL/uL — AB (ref 3.87–5.11)
RDW: 12.4 % (ref 11.5–15.5)
WBC: 6.1 10*3/uL (ref 4.0–10.5)

## 2017-04-02 LAB — LIPASE, BLOOD: Lipase: 54 U/L — ABNORMAL HIGH (ref 11–51)

## 2017-04-02 MED ORDER — SODIUM CHLORIDE 0.9 % IV BOLUS (SEPSIS)
500.0000 mL | Freq: Once | INTRAVENOUS | Status: AC
  Administered 2017-04-02: 500 mL via INTRAVENOUS

## 2017-04-02 MED ORDER — ONDANSETRON 4 MG PO TBDP: ORAL_TABLET | ORAL | 0 refills | Status: DC

## 2017-04-02 MED ORDER — ONDANSETRON HCL 4 MG/2ML IJ SOLN
4.0000 mg | Freq: Once | INTRAMUSCULAR | Status: AC | PRN
  Administered 2017-04-02: 4 mg via INTRAVENOUS

## 2017-04-02 MED ORDER — ONDANSETRON HCL 4 MG/2ML IJ SOLN
INTRAMUSCULAR | Status: AC
  Filled 2017-04-02: qty 2

## 2017-04-02 MED ORDER — PANTOPRAZOLE SODIUM 20 MG PO TBEC: 20.0000 mg | DELAYED_RELEASE_TABLET | Freq: Every day | ORAL | 0 refills | Status: DC

## 2017-04-02 NOTE — ED Triage Notes (Signed)
Vomiting on car ride from [place]. Weak and decreased appetite for a while per daughter.

## 2017-04-02 NOTE — ED Provider Notes (Signed)
[facility] DEPT Provider Note   CSN: [id number] Arrival date & time: 04/02/17  1425     History   Chief Complaint Chief Complaint  Patient presents with  . Emesis    HPI Pamela Woods is a 83 y.o. female.  HPI Patient brought in by her 2 daughters. Has history of dementia. Unable to contribute history. Per daughter patient's had several weeks of spitting up clear sputum. She's had mild cough. Daughter states she is in her baseline mental status. Began having several bouts of "bringing up" dark liquid. No food products identified. Brought to the emergency department for evaluation. Daughter states denied any recent diarrhea. She has been treated with antihistamines and started on Lexapro recently. Past Medical History:  Diagnosis Date  . Hyperlipidemia   . Hypertension     There are no active problems to display for this patient.   Past Surgical History:  Procedure Laterality Date  . RADICAL HYSTERECTOMY WITH TRANSPOSITION OF OVARIES Bilateral    removal of ovaries    OB History    No data available       Home Medications    Prior to Admission medications   Medication Sig Start Date End Date Taking? Authorizing Provider  amLODipine (NORVASC) 5 MG tablet Take 5 mg by mouth daily. 06/27/15   [provider]  carvedilol (COREG) 12.5 MG tablet Take 12.5 mg by mouth daily.    [provider]  cholecalciferol (VITAMIN D) 1000 UNITS tablet Take 1,000 Units by mouth daily.    [provider]  olmesartan-hydrochlorothiazide (BENICAR HCT) 20-12.5 MG tablet Take 1 tablet by mouth daily. 06/27/15   [provider]  ondansetron (ZOFRAN ODT) 4 MG disintegrating tablet 4mg  ODT q4 hours prn nausea/vomit 04/02/17   Julianne Rice, MD  pantoprazole (PROTONIX) 20 MG tablet Take 1 tablet (20 mg total) by mouth daily. 04/02/17   Julianne Rice, MD  ranitidine (ZANTAC) 150 MG tablet Take 150 mg by mouth daily.     [provider]    vitamin C (ASCORBIC ACID) 500 MG tablet Take 500 mg by mouth daily.    [provider]    Family History No family history on file.  Social History Social History  Substance Use Topics  . Smoking status: Never Smoker  . Smokeless tobacco: Never Used  . Alcohol use Not on file     Allergies   Ampicillin; Cephalosporins; Doxycycline; Macrobid [nitrofurantoin]; Relafen [nabumetone]; and Sulfur   Review of Systems Review of Systems  Unable to perform ROS: Dementia     Physical Exam Updated Vital Signs BP (!) 151/70   Pulse 89   Temp 98 F (36.7 C) (Oral)   Resp 18   Ht 5\' 1"  (1.549 m)   Wt 45.4 kg (100 lb)   SpO2 99%   BMI 18.89 kg/m   Physical Exam  Constitutional: She appears well-developed and well-nourished. No distress.  HENT:  Head: Normocephalic and atraumatic.  Mouth/Throat: Oropharynx is clear and moist. No oropharyngeal exudate.  No intraoral trauma.  Eyes: Pupils are equal, round, and reactive to light. EOM are normal.  Neck: Normal range of motion. Neck supple.  Cardiovascular: Normal rate and regular rhythm.  Exam reveals no gallop and no friction rub.   No murmur heard. Pulmonary/Chest: Effort normal and breath sounds normal. No respiratory distress. She has no wheezes. She has no rales. She exhibits no tenderness.  Abdominal: Soft. Bowel sounds are normal. She exhibits no distension. There is no tenderness. There  is no rebound and no guarding. No hernia.  Musculoskeletal: Normal range of motion. She exhibits no edema or tenderness.   No lower extremity swelling, asymmetry or tenderness. Distal pulses intact.  Neurological: She is alert.  Patient's alert and following commands. Moving all extremities without focal deficit. Sensation intact.  Skin: Skin is warm and dry. No rash noted. No erythema.  Psychiatric: She has a normal mood and affect. Her behavior is normal.  Nursing note and vitals reviewed.    ED Treatments / Results   Labs (all labs ordered are listed, but only abnormal results are displayed) Labs Reviewed  LIPASE, BLOOD - Abnormal; Notable for the following:       Result Value   Lipase 54 (*)    All other components within normal limits  COMPREHENSIVE METABOLIC PANEL - Abnormal; Notable for the following:    Chloride 100 (*)    Glucose, Bld 160 (*)    BUN 30 (*)    Creatinine, Ser 1.64 (*)    ALT 11 (*)    Total Bilirubin 1.3 (*)    GFR calc non Af Amer 26 (*)    GFR calc Af Amer 30 (*)    All other components within normal limits  CBC - Abnormal; Notable for the following:    RBC 3.74 (*)    Hemoglobin 10.9 (*)    HCT 33.9 (*)    All other components within normal limits  URINALYSIS, ROUTINE W REFLEX MICROSCOPIC - Abnormal; Notable for the following:    Specific Gravity, Urine <1.005 (*)    All other components within normal limits    EKG  EKG Interpretation None       Radiology Dg Abd Acute W/chest  Result Date: 04/02/2017 CLINICAL DATA:  Nausea and vomiting, 2 weeks duration. EXAM: DG ABDOMEN ACUTE W/ 1V CHEST COMPARISON:  05/01/2011.  04/14/2011. FINDINGS: Bowel gas pattern is normal without evidence of ileus, obstruction or free air. Clips in the right upper quadrant consistent with previous cholecystectomy. No worrisome calcifications or bone findings. One-view chest shows normal heart shadow. Aortic atherosclerosis. Lungs are clear. The vascularity is normal. No effusions. No free air under the diaphragm. IMPRESSION: Negative abdominal radiographs.  No acute cardiopulmonary disease. Electronically Signed   By: Nelson Chimes M.D.   On: 04/02/2017 15:45    Procedures Procedures (including critical care time)  Medications Ordered in ED Medications  ondansetron (ZOFRAN) injection 4 mg (4 mg Intravenous Given 04/02/17 1450)  sodium chloride 0.9 % bolus 500 mL (0 mLs Intravenous Stopped 04/02/17 1741)  sodium chloride 0.9 % bolus 500 mL (0 mLs Intravenous Stopped 04/02/17 2036)      Initial Impression / Assessment and Plan / ED Course  I have reviewed the triage vital signs and the nursing notes.  Pertinent labs & imaging results that were available during my care of the patient were reviewed by me and considered in my medical decision making (see chart for details).    Workup significant for mild elevation in creatinine. Unknown baseline. Question due to dehydration. Given 2 boluses of IV fluids. Patient's been able to tolerate oral intake but she did have some mild nausea associated with this. Likely gastrointestinal dysfunction question esophageal stricture or achalasia. Advise close follow-up with gastroenterologist. Daughters state that they are comfortable taking the patient home. Have been given extensive return precautions and voiced understanding.   Final Clinical Impressions(s) / ED Diagnoses   Final diagnoses:  Vomiting in adult  Dehydration  New Prescriptions Discharge Medication List as of 04/02/2017  9:24 PM    START taking these medications   Details  ondansetron (ZOFRAN ODT) 4 MG disintegrating tablet 4mg  ODT q4 hours prn nausea/vomit, Print    pantoprazole (PROTONIX) 20 MG tablet Take 1 tablet (20 mg total) by mouth daily., Starting Fri 04/02/2017, Print         Julianne Rice, MD 04/03/17 1540

## 2017-04-07 ENCOUNTER — Other Ambulatory Visit: Payer: Self-pay | Admitting: Physician Assistant

## 2017-04-07 DIAGNOSIS — R1110 Vomiting, unspecified: Secondary | ICD-10-CM | POA: Diagnosis not present

## 2017-04-07 DIAGNOSIS — D649 Anemia, unspecified: Secondary | ICD-10-CM | POA: Diagnosis not present

## 2017-04-07 DIAGNOSIS — R630 Anorexia: Secondary | ICD-10-CM | POA: Diagnosis not present

## 2017-04-07 DIAGNOSIS — K5900 Constipation, unspecified: Secondary | ICD-10-CM | POA: Diagnosis not present

## 2017-04-07 DIAGNOSIS — N184 Chronic kidney disease, stage 4 (severe): Secondary | ICD-10-CM | POA: Diagnosis not present

## 2017-04-07 DIAGNOSIS — K219 Gastro-esophageal reflux disease without esophagitis: Secondary | ICD-10-CM

## 2017-04-07 DIAGNOSIS — R634 Abnormal weight loss: Secondary | ICD-10-CM | POA: Diagnosis not present

## 2017-04-13 ENCOUNTER — Other Ambulatory Visit: Payer: Self-pay | Admitting: Physician Assistant

## 2017-04-13 ENCOUNTER — Ambulatory Visit
Admission: RE | Admit: 2017-04-13 | Discharge: 2017-04-13 | Disposition: A | Discharge status: Home / Self Care | Payer: Medicare Other | Source: Ambulatory Visit | Attending: Physician Assistant | Admitting: Physician Assistant

## 2017-04-13 DIAGNOSIS — R634 Abnormal weight loss: Secondary | ICD-10-CM

## 2017-04-13 DIAGNOSIS — K219 Gastro-esophageal reflux disease without esophagitis: Secondary | ICD-10-CM

## 2017-04-13 DIAGNOSIS — R1110 Vomiting, unspecified: Secondary | ICD-10-CM

## 2017-04-21 DIAGNOSIS — K219 Gastro-esophageal reflux disease without esophagitis: Secondary | ICD-10-CM | POA: Diagnosis not present

## 2017-04-21 DIAGNOSIS — R634 Abnormal weight loss: Secondary | ICD-10-CM | POA: Diagnosis not present

## 2017-04-21 DIAGNOSIS — D509 Iron deficiency anemia, unspecified: Secondary | ICD-10-CM | POA: Diagnosis not present

## 2017-04-21 DIAGNOSIS — K5900 Constipation, unspecified: Secondary | ICD-10-CM | POA: Diagnosis not present

## 2017-04-28 DIAGNOSIS — Z23 Encounter for immunization: Secondary | ICD-10-CM | POA: Diagnosis not present

## 2017-05-11 DIAGNOSIS — H524 Presbyopia: Secondary | ICD-10-CM | POA: Diagnosis not present

## 2017-05-11 DIAGNOSIS — H43813 Vitreous degeneration, bilateral: Secondary | ICD-10-CM | POA: Diagnosis not present

## 2017-05-11 DIAGNOSIS — H353131 Nonexudative age-related macular degeneration, bilateral, early dry stage: Secondary | ICD-10-CM | POA: Diagnosis not present

## 2017-05-11 DIAGNOSIS — H04123 Dry eye syndrome of bilateral lacrimal glands: Secondary | ICD-10-CM | POA: Diagnosis not present

## 2017-05-11 DIAGNOSIS — H52223 Regular astigmatism, bilateral: Secondary | ICD-10-CM | POA: Diagnosis not present

## 2017-05-12 DIAGNOSIS — D509 Iron deficiency anemia, unspecified: Secondary | ICD-10-CM | POA: Diagnosis not present

## 2017-05-14 ENCOUNTER — Encounter: Payer: Self-pay | Admitting: Podiatry

## 2017-05-14 ENCOUNTER — Ambulatory Visit (INDEPENDENT_AMBULATORY_CARE_PROVIDER_SITE_OTHER): Payer: Medicare Other | Admitting: Podiatry

## 2017-05-14 DIAGNOSIS — B351 Tinea unguium: Secondary | ICD-10-CM | POA: Diagnosis not present

## 2017-05-14 DIAGNOSIS — M79676 Pain in unspecified toe(s): Secondary | ICD-10-CM | POA: Diagnosis not present

## 2017-05-14 NOTE — Progress Notes (Signed)
Patient ID: Pamela Woods, female   DOB: [birth year], 83 y.o.   MRN: [id number] Complaint:  Visit Type: Patient returns to my office for continued preventative foot care services. Complaint: Patient states" my nails have grown long and thick and become painful to walk and wear shoes" . The patient presents for preventative foot care services. No changes to ROS.  She admits to throbbing pain in right toe inside border.  Podiatric Exam: Vascular: dorsalis pedis and posterior tibial pulses are palpable bilateral. Capillary return is immediate. Temperature gradient is WNL. Skin turgor WNL  Sensorium: Normal Semmes Weinstein monofilament test. Normal tactile sensation bilaterally. Nail Exam: Pt has thick disfigured discolored nails with subungual debris noted bilateral entire nail hallux . Ulcer Exam: There is no evidence of ulcer or pre-ulcerative changes or infection. Orthopedic Exam: Muscle tone and strength are WNL. No limitations in general ROM. No crepitus or effusions noted. Foot type and digits show no abnormalities. Bony prominences are unremarkable. Skin: No Porokeratosis. No infection or ulcers  Diagnosis:  Onychomycosis, , Pain in right toe, pain in left toes  Treatment & Plan Procedures and Treatment: Consent by patient was obtained for treatment procedures. The patient understood the discussion of treatment and procedures well. All questions were answered thoroughly reviewed. Debridement of mycotic and hypertrophic toenails, 1 through 5 bilateral and clearing of subungual debris. No ulceration, no infection noted.  Return Visit-Office Procedure: Patient instructed to return to the office for a follow up visit 9 weeks  for continued evaluation and treatment.    Gardiner Barefoot DPM

## 2017-06-24 DIAGNOSIS — E7800 Pure hypercholesterolemia, unspecified: Secondary | ICD-10-CM | POA: Diagnosis not present

## 2017-06-24 DIAGNOSIS — D631 Anemia in chronic kidney disease: Secondary | ICD-10-CM | POA: Diagnosis not present

## 2017-06-24 DIAGNOSIS — I129 Hypertensive chronic kidney disease with stage 1 through stage 4 chronic kidney disease, or unspecified chronic kidney disease: Secondary | ICD-10-CM | POA: Diagnosis not present

## 2017-06-24 DIAGNOSIS — G309 Alzheimer's disease, unspecified: Secondary | ICD-10-CM | POA: Diagnosis not present

## 2017-06-24 DIAGNOSIS — R42 Dizziness and giddiness: Secondary | ICD-10-CM | POA: Diagnosis not present

## 2017-06-24 DIAGNOSIS — N189 Chronic kidney disease, unspecified: Secondary | ICD-10-CM | POA: Diagnosis not present

## 2017-07-16 ENCOUNTER — Ambulatory Visit (INDEPENDENT_AMBULATORY_CARE_PROVIDER_SITE_OTHER): Payer: Medicare Other | Admitting: Podiatry

## 2017-07-16 ENCOUNTER — Encounter: Payer: Self-pay | Admitting: Podiatry

## 2017-07-16 DIAGNOSIS — B351 Tinea unguium: Secondary | ICD-10-CM

## 2017-07-16 DIAGNOSIS — M79676 Pain in unspecified toe(s): Secondary | ICD-10-CM

## 2017-07-16 NOTE — Progress Notes (Signed)
Patient ID: Pamela Woods, female   DOB: 05-Oct-1921, 83 y.o.   MRN: [id number] Complaint:  Visit Type: Patient returns to my office for continued preventative foot care services. Complaint: Patient states" my nails have grown long and thick and become painful to walk and wear shoes" . The patient presents for preventative foot care services. No changes to ROS.  She admits to throbbing pain in right toe inside border.  Podiatric Exam: Vascular: dorsalis pedis and posterior tibial pulses are palpable bilateral. Capillary return is immediate. Temperature gradient is WNL. Skin turgor WNL  Sensorium: Normal Semmes Weinstein monofilament test. Normal tactile sensation bilaterally. Nail Exam: Pt has thick disfigured discolored nails with subungual debris noted bilateral entire nail hallux . Ulcer Exam: There is no evidence of ulcer or pre-ulcerative changes or infection. Orthopedic Exam: Muscle tone and strength are WNL. No limitations in general ROM. No crepitus or effusions noted. Foot type and digits show no abnormalities. Bony prominences are unremarkable. Skin: No Porokeratosis. No infection or ulcers  Diagnosis:  Onychomycosis, , Pain in right toe, pain in left toes  Treatment & Plan Procedures and Treatment: Consent by patient was obtained for treatment procedures. The patient understood the discussion of treatment and procedures well. All questions were answered thoroughly reviewed. Debridement of mycotic and hypertrophic toenails, 1 through 5 bilateral and clearing of subungual debris. No ulceration, no infection noted.  Return Visit-Office Procedure: Patient instructed to return to the office for a follow up visit 9 weeks  for continued evaluation and treatment.    Gardiner Barefoot DPM

## 2017-08-16 DIAGNOSIS — D509 Iron deficiency anemia, unspecified: Secondary | ICD-10-CM | POA: Diagnosis not present

## 2017-09-17 ENCOUNTER — Ambulatory Visit: Payer: Medicare Other | Admitting: Podiatry

## 2017-09-17 DIAGNOSIS — K641 Second degree hemorrhoids: Secondary | ICD-10-CM | POA: Diagnosis not present

## 2017-10-05 ENCOUNTER — Encounter: Payer: Self-pay | Admitting: Podiatry

## 2017-10-05 ENCOUNTER — Ambulatory Visit (INDEPENDENT_AMBULATORY_CARE_PROVIDER_SITE_OTHER): Payer: Medicare Other | Admitting: Podiatry

## 2017-10-05 DIAGNOSIS — M79674 Pain in right toe(s): Secondary | ICD-10-CM

## 2017-10-05 DIAGNOSIS — B351 Tinea unguium: Secondary | ICD-10-CM

## 2017-10-05 DIAGNOSIS — M79675 Pain in left toe(s): Secondary | ICD-10-CM

## 2017-10-05 NOTE — Progress Notes (Signed)
Patient ID: Pamela Woods, female   DOB: 01/07/1922, 84 y.o.   MRN: [id number] Complaint:  Visit Type: Patient returns to my office for continued preventative foot care services. Complaint: Patient states" my nails have grown long and thick and become painful to walk and wear shoes" . The patient presents for preventative foot care services. No changes to ROS.  She admits to throbbing pain in right toe inside border.  Podiatric Exam: Vascular: dorsalis pedis and posterior tibial pulses are palpable bilateral. Capillary return is immediate. Temperature gradient is WNL. Skin turgor WNL  Sensorium: Normal Semmes Weinstein monofilament test. Normal tactile sensation bilaterally. Nail Exam: Pt has thick disfigured discolored nails with subungual debris noted bilateral entire nail hallux . Ulcer Exam: There is no evidence of ulcer or pre-ulcerative changes or infection. Orthopedic Exam: Muscle tone and strength are WNL. No limitations in general ROM. No crepitus or effusions noted. Foot type and digits show no abnormalities. Bony prominences are unremarkable. Skin: No Porokeratosis. No infection or ulcers  Diagnosis:  Onychomycosis, , Pain in right toe, pain in left toes  Treatment & Plan Procedures and Treatment: Consent by patient was obtained for treatment procedures. The patient understood the discussion of treatment and procedures well. All questions were answered thoroughly reviewed. Debridement of mycotic and hypertrophic toenails, 1 through 5 bilateral and clearing of subungual debris. No ulceration, no infection noted.  Return Visit-Office Procedure: Patient instructed to return to the office for a follow up visit 9 weeks  for continued evaluation and treatment.    Jeane Cashatt DPM 

## 2017-10-18 DIAGNOSIS — R195 Other fecal abnormalities: Secondary | ICD-10-CM | POA: Diagnosis not present

## 2017-10-18 DIAGNOSIS — Z01411 Encounter for gynecological examination (general) (routine) with abnormal findings: Secondary | ICD-10-CM | POA: Diagnosis not present

## 2017-11-03 DIAGNOSIS — R8290 Unspecified abnormal findings in urine: Secondary | ICD-10-CM | POA: Diagnosis not present

## 2017-11-03 DIAGNOSIS — R82998 Other abnormal findings in urine: Secondary | ICD-10-CM | POA: Diagnosis not present

## 2017-11-03 DIAGNOSIS — H6123 Impacted cerumen, bilateral: Secondary | ICD-10-CM | POA: Diagnosis not present

## 2017-11-03 DIAGNOSIS — R42 Dizziness and giddiness: Secondary | ICD-10-CM | POA: Diagnosis not present

## 2017-11-03 DIAGNOSIS — K921 Melena: Secondary | ICD-10-CM | POA: Diagnosis not present

## 2017-11-15 DIAGNOSIS — D509 Iron deficiency anemia, unspecified: Secondary | ICD-10-CM | POA: Diagnosis not present

## 2017-11-15 DIAGNOSIS — G309 Alzheimer's disease, unspecified: Secondary | ICD-10-CM | POA: Diagnosis not present

## 2017-11-15 DIAGNOSIS — N184 Chronic kidney disease, stage 4 (severe): Secondary | ICD-10-CM | POA: Diagnosis not present

## 2017-11-15 DIAGNOSIS — D631 Anemia in chronic kidney disease: Secondary | ICD-10-CM | POA: Diagnosis not present

## 2017-11-24 DIAGNOSIS — Z1231 Encounter for screening mammogram for malignant neoplasm of breast: Secondary | ICD-10-CM | POA: Diagnosis not present

## 2017-12-07 ENCOUNTER — Encounter: Payer: Self-pay | Admitting: Podiatry

## 2017-12-07 ENCOUNTER — Ambulatory Visit (INDEPENDENT_AMBULATORY_CARE_PROVIDER_SITE_OTHER): Payer: Medicare Other | Admitting: Podiatry

## 2017-12-07 DIAGNOSIS — M79674 Pain in right toe(s): Secondary | ICD-10-CM | POA: Diagnosis not present

## 2017-12-07 DIAGNOSIS — M79675 Pain in left toe(s): Secondary | ICD-10-CM | POA: Diagnosis not present

## 2017-12-07 DIAGNOSIS — B351 Tinea unguium: Secondary | ICD-10-CM

## 2017-12-07 NOTE — Progress Notes (Signed)
Patient ID: Pamela Woods, female   DOB: 09/04/1921, 84 y.o.   MRN: [id number] Complaint:  Visit Type: Patient returns to my office for continued preventative foot care services. Complaint: Patient states" my nails have grown long and thick and become painful to walk and wear shoes" . The patient presents for preventative foot care services. No changes to ROS.  She admits to throbbing pain in right toe inside border.  Podiatric Exam: Vascular: dorsalis pedis and posterior tibial pulses are palpable bilateral. Capillary return is immediate. Temperature gradient is WNL. Skin turgor WNL  Sensorium: Normal Semmes Weinstein monofilament test. Normal tactile sensation bilaterally. Nail Exam: Pt has thick disfigured discolored nails with subungual debris noted bilateral entire nail hallux . Ulcer Exam: There is no evidence of ulcer or pre-ulcerative changes or infection. Orthopedic Exam: Muscle tone and strength are WNL. No limitations in general ROM. No crepitus or effusions noted. Foot type and digits show no abnormalities. Bony prominences are unremarkable. Skin: No Porokeratosis. No infection or ulcers  Diagnosis:  Onychomycosis, , Pain in right toe, pain in left toes  Treatment & Plan Procedures and Treatment: Consent by patient was obtained for treatment procedures. The patient understood the discussion of treatment and procedures well. All questions were answered thoroughly reviewed. Debridement of mycotic and hypertrophic toenails, 1 through 5 bilateral and clearing of subungual debris. No ulceration, no infection noted.  Return Visit-Office Procedure: Patient instructed to return to the office for a follow up visit 9 weeks  for continued evaluation and treatment.    Stein Windhorst DPM 

## 2017-12-15 DIAGNOSIS — K921 Melena: Secondary | ICD-10-CM | POA: Diagnosis not present

## 2017-12-23 DIAGNOSIS — Z8551 Personal history of malignant neoplasm of bladder: Secondary | ICD-10-CM | POA: Diagnosis not present

## 2017-12-23 DIAGNOSIS — E7800 Pure hypercholesterolemia, unspecified: Secondary | ICD-10-CM | POA: Diagnosis not present

## 2017-12-23 DIAGNOSIS — I129 Hypertensive chronic kidney disease with stage 1 through stage 4 chronic kidney disease, or unspecified chronic kidney disease: Secondary | ICD-10-CM | POA: Diagnosis not present

## 2017-12-23 DIAGNOSIS — Z0000 Encounter for general adult medical examination without abnormal findings: Secondary | ICD-10-CM | POA: Diagnosis not present

## 2017-12-23 DIAGNOSIS — G309 Alzheimer's disease, unspecified: Secondary | ICD-10-CM | POA: Diagnosis not present

## 2017-12-23 DIAGNOSIS — D631 Anemia in chronic kidney disease: Secondary | ICD-10-CM | POA: Diagnosis not present

## 2017-12-23 DIAGNOSIS — N184 Chronic kidney disease, stage 4 (severe): Secondary | ICD-10-CM | POA: Diagnosis not present

## 2017-12-23 DIAGNOSIS — K219 Gastro-esophageal reflux disease without esophagitis: Secondary | ICD-10-CM | POA: Diagnosis not present

## 2018-02-08 ENCOUNTER — Ambulatory Visit: Payer: Medicare Other | Admitting: Podiatry

## 2018-02-08 DIAGNOSIS — H52223 Regular astigmatism, bilateral: Secondary | ICD-10-CM | POA: Diagnosis not present

## 2018-02-08 DIAGNOSIS — H353131 Nonexudative age-related macular degeneration, bilateral, early dry stage: Secondary | ICD-10-CM | POA: Diagnosis not present

## 2018-02-08 DIAGNOSIS — H524 Presbyopia: Secondary | ICD-10-CM | POA: Diagnosis not present

## 2018-02-08 DIAGNOSIS — H43813 Vitreous degeneration, bilateral: Secondary | ICD-10-CM | POA: Diagnosis not present

## 2018-02-08 DIAGNOSIS — H04123 Dry eye syndrome of bilateral lacrimal glands: Secondary | ICD-10-CM | POA: Diagnosis not present

## 2018-02-09 ENCOUNTER — Ambulatory Visit (INDEPENDENT_AMBULATORY_CARE_PROVIDER_SITE_OTHER): Payer: Medicare Other | Admitting: Podiatry

## 2018-02-09 ENCOUNTER — Encounter: Payer: Self-pay | Admitting: Podiatry

## 2018-02-09 DIAGNOSIS — M79675 Pain in left toe(s): Secondary | ICD-10-CM

## 2018-02-09 DIAGNOSIS — M79674 Pain in right toe(s): Secondary | ICD-10-CM

## 2018-02-09 DIAGNOSIS — B351 Tinea unguium: Secondary | ICD-10-CM | POA: Diagnosis not present

## 2018-02-09 NOTE — Progress Notes (Signed)
Patient ID: Pamela Woods, female   DOB: 15-Mar-1922, 84 y.o.   MRN: [id number] Complaint:  Visit Type: Patient returns to my office for continued preventative foot care services. Complaint: Patient states" my nails have grown long and thick and become painful to walk and wear shoes" . The patient presents for preventative foot care services. No changes to ROS.  She admits to throbbing pain in right toe inside border.  Podiatric Exam: Vascular: dorsalis pedis and posterior tibial pulses are palpable bilateral. Capillary return is immediate. Temperature gradient is WNL. Skin turgor WNL  Sensorium: Normal Semmes Weinstein monofilament test. Normal tactile sensation bilaterally. Nail Exam: Pt has thick disfigured discolored nails with subungual debris noted bilateral entire nail hallux . Ulcer Exam: There is no evidence of ulcer or pre-ulcerative changes or infection. Orthopedic Exam: Muscle tone and strength are WNL. No limitations in general ROM. No crepitus or effusions noted. Foot type and digits show no abnormalities. Bony prominences are unremarkable. Skin: No Porokeratosis. No infection or ulcers  Diagnosis:  Onychomycosis, , Pain in right toe, pain in left toes  Treatment & Plan Procedures and Treatment: Consent by patient was obtained for treatment procedures. The patient understood the discussion of treatment and procedures well. All questions were answered thoroughly reviewed. Debridement of mycotic and hypertrophic toenails, 1 through 5 bilateral and clearing of subungual debris. No ulceration, no infection noted.  Return Visit-Office Procedure: Patient instructed to return to the office for a follow up visit 9 weeks  for continued evaluation and treatment.    Gardiner Barefoot DPM

## 2018-02-14 DIAGNOSIS — K921 Melena: Secondary | ICD-10-CM | POA: Diagnosis not present

## 2018-02-22 ENCOUNTER — Other Ambulatory Visit: Payer: Self-pay

## 2018-02-22 ENCOUNTER — Emergency Department (HOSPITAL_BASED_OUTPATIENT_CLINIC_OR_DEPARTMENT_OTHER): Payer: Medicare Other

## 2018-02-22 ENCOUNTER — Encounter (HOSPITAL_BASED_OUTPATIENT_CLINIC_OR_DEPARTMENT_OTHER): Payer: Self-pay | Admitting: Emergency Medicine

## 2018-02-22 ENCOUNTER — Emergency Department (HOSPITAL_BASED_OUTPATIENT_CLINIC_OR_DEPARTMENT_OTHER)
Admission: EM | Admit: 2018-02-22 | Discharge: 2018-02-22 | Disposition: A | Discharge status: Home / Self Care | Payer: Medicare Other | Attending: Emergency Medicine | Admitting: Emergency Medicine

## 2018-02-22 DIAGNOSIS — Z8551 Personal history of malignant neoplasm of bladder: Secondary | ICD-10-CM | POA: Insufficient documentation

## 2018-02-22 DIAGNOSIS — I10 Essential (primary) hypertension: Secondary | ICD-10-CM

## 2018-02-22 DIAGNOSIS — E860 Dehydration: Secondary | ICD-10-CM | POA: Diagnosis not present

## 2018-02-22 DIAGNOSIS — R5383 Other fatigue: Secondary | ICD-10-CM | POA: Diagnosis not present

## 2018-02-22 DIAGNOSIS — F0390 Unspecified dementia without behavioral disturbance: Secondary | ICD-10-CM | POA: Diagnosis not present

## 2018-02-22 DIAGNOSIS — R42 Dizziness and giddiness: Secondary | ICD-10-CM

## 2018-02-22 HISTORY — DX: Malignant neoplasm of bladder, unspecified: C67.9

## 2018-02-22 HISTORY — DX: Unspecified dementia, unspecified severity, without behavioral disturbance, psychotic disturbance, mood disturbance, and anxiety: F03.90

## 2018-02-22 LAB — COMPREHENSIVE METABOLIC PANEL
ALT: 15 U/L (ref 0–44)
ANION GAP: 8 (ref 5–15)
AST: 20 U/L (ref 15–41)
Albumin: 3.6 g/dL (ref 3.5–5.0)
Alkaline Phosphatase: 56 U/L (ref 38–126)
BILIRUBIN TOTAL: 1.1 mg/dL (ref 0.3–1.2)
BUN: 31 mg/dL — AB (ref 8–23)
CHLORIDE: 101 mmol/L (ref 98–111)
CO2: 31 mmol/L (ref 22–32)
Calcium: 9.2 mg/dL (ref 8.9–10.3)
Creatinine, Ser: 1.66 mg/dL — ABNORMAL HIGH (ref 0.44–1.00)
GFR calc Af Amer: 29 mL/min — ABNORMAL LOW (ref >60)
GFR calc non Af Amer: 25 mL/min — ABNORMAL LOW (ref >60)
Glucose, Bld: 86 mg/dL (ref 70–99)
POTASSIUM: 4 mmol/L (ref 3.5–5.1)
Sodium: 140 mmol/L (ref 135–145)
TOTAL PROTEIN: 6.7 g/dL (ref 6.5–8.1)

## 2018-02-22 LAB — CBC WITH DIFFERENTIAL/PLATELET
BASOS ABS: 0 10*3/uL (ref 0.0–0.1)
Basophils Relative: 0 %
EOS ABS: 0.1 10*3/uL (ref 0.0–0.7)
EOS PCT: 2 %
HCT: 33 % — ABNORMAL LOW (ref 36.0–46.0)
HEMOGLOBIN: 10.9 g/dL — AB (ref 12.0–15.0)
LYMPHS PCT: 20 %
Lymphs Abs: 1 10*3/uL (ref 0.7–4.0)
MCH: 30.9 pg (ref 26.0–34.0)
MCHC: 33 g/dL (ref 30.0–36.0)
MCV: 93.5 fL (ref 78.0–100.0)
Monocytes Absolute: 0.5 10*3/uL (ref 0.1–1.0)
Monocytes Relative: 10 %
Neutro Abs: 3.3 10*3/uL (ref 1.7–7.7)
Neutrophils Relative %: 68 %
PLATELETS: 120 10*3/uL — AB (ref 150–400)
RBC: 3.53 MIL/uL — AB (ref 3.87–5.11)
RDW: 12.5 % (ref 11.5–15.5)
WBC: 4.9 10*3/uL (ref 4.0–10.5)

## 2018-02-22 LAB — URINALYSIS, ROUTINE W REFLEX MICROSCOPIC
Bilirubin Urine: NEGATIVE
Glucose, UA: NEGATIVE mg/dL
Hgb urine dipstick: NEGATIVE
Ketones, ur: NEGATIVE mg/dL
LEUKOCYTES UA: NEGATIVE
NITRITE: NEGATIVE
PH: 7 (ref 5.0–8.0)
Protein, ur: NEGATIVE mg/dL

## 2018-02-22 LAB — TROPONIN I

## 2018-02-22 MED ORDER — MECLIZINE HCL 12.5 MG PO TABS: 12.5000 mg | ORAL_TABLET | Freq: Three times a day (TID) | ORAL | 0 refills | Status: DC | PRN

## 2018-02-22 MED ORDER — MECLIZINE HCL 25 MG PO TABS
12.5000 mg | ORAL_TABLET | Freq: Once | ORAL | Status: AC
  Administered 2018-02-22: 12.5 mg via ORAL
  Filled 2018-02-22: qty 1

## 2018-02-22 MED ORDER — SODIUM CHLORIDE 0.9 % IV BOLUS
500.0000 mL | Freq: Once | INTRAVENOUS | Status: AC
  Administered 2018-02-22: 500 mL via INTRAVENOUS

## 2018-02-22 NOTE — ED Triage Notes (Signed)
Reports dizziness since Sunday night.  Reports that she also has been sleeping longer than normal.  Reports any time she is up walking around since then she has been dizzy.  Denies nausea, vomiting, fevers.

## 2018-02-22 NOTE — ED Provider Notes (Signed)
Emergency Department Provider Note   I have reviewed the triage vital signs and the nursing notes.   HISTORY  Chief Complaint Dizziness   HPI Pamela Woods is a 84 y.o. female with PMH of HLD. HTN, and Dementia comes to the emergency department for evaluation of worsening "dizziness" and decreased energy over the past week.  Family states that she has had vertigo in the past and has been discharged home with meclizine. Symptoms are intermittent and worse in the AM. They report that meclizine seems to help briefly but then symptoms inevitably return and her vertigo has been worse over the past week/more frequent. She states her energy levels have decreased over the past 2 days.  They have not recorded any fevers or observe any shaking chills.  The patient denies any pain, cough, vomiting, diarrhea.  No dysuria.  She denies any weakness or numbness.  Family has not noticed any speech changes.  Past Medical History:  Diagnosis Date  . Bladder cancer ([place])   . Dementia   . Hyperlipidemia   . Hypertension     There are no active problems to display for this patient.   Past Surgical History:  Procedure Laterality Date  . CHOLECYSTECTOMY    . RADICAL HYSTERECTOMY WITH TRANSPOSITION OF OVARIES Bilateral    removal of ovaries    Allergies Ampicillin; Cephalosporins; Doxycycline; Nitrofurantoin; Relafen [nabumetone]; and Sulfur  History reviewed. No pertinent family history.  Social History Social History   Tobacco Use  . Smoking status: Never Smoker  . Smokeless tobacco: Never Used  Substance Use Topics  . Alcohol use: Never    Frequency: Never  . Drug use: Never    Review of Systems  Constitutional: No fever/chills. Positive fatigue.  Eyes: No visual changes. ENT: No sore throat. Positive vertigo.  Cardiovascular: Denies chest pain. Respiratory: Denies shortness of breath. Gastrointestinal: No abdominal pain.  No nausea, no vomiting.  No diarrhea.  No  constipation. Genitourinary: Negative for dysuria. Musculoskeletal: Negative for back pain. Skin: Negative for rash. Neurological: Negative for headaches, focal weakness or numbness.  10-point ROS otherwise negative.  ____________________________________________   PHYSICAL EXAM:  VITAL SIGNS: ED Triage Vitals [02/22/18 1226]  Enc Vitals Group     BP (!) 173/73     Pulse Rate 76     Resp 16     Temp 98.1 F (36.7 C)     Temp Source Oral     SpO2 99 %     Weight 100 lb (45.4 kg)     Height 5\' 2"  (1.575 m)     Pain Score 0    Constitutional: Alert with some mild confusion. Well appearing and in no acute distress. Eyes: Conjunctivae are normal. PERRL. EOMI. Cerumen in both ears but view of TMs bilaterally is normal.  Head: Atraumatic. Nose: No congestion/rhinnorhea. Mouth/Throat: Mucous membranes are moist.   Neck: No stridor.  Cardiovascular: Normal rate, regular rhythm. Good peripheral circulation. Grossly normal heart sounds.   Respiratory: Normal respiratory effort.  No retractions. Lungs CTAB. Gastrointestinal: Soft and nontender. No distention.  Musculoskeletal: No lower extremity tenderness nor edema. No gross deformities of extremities. Neurologic:  Normal speech and language. No gross focal neurologic deficits are appreciated. Normal CN exam 2-12. No pronator drift.  Skin:  Skin is warm, dry and intact. No rash noted.  ____________________________________________   LABS (all labs ordered are listed, but only abnormal results are displayed)  Labs Reviewed  COMPREHENSIVE METABOLIC PANEL - Abnormal; Notable for the  following components:      Result Value   BUN 31 (*)    Creatinine, Ser 1.66 (*)    GFR calc non Af Amer 25 (*)    GFR calc Af Amer 29 (*)    All other components within normal limits  CBC WITH DIFFERENTIAL/PLATELET - Abnormal; Notable for the following components:   RBC 3.53 (*)    Hemoglobin 10.9 (*)    HCT 33.0 (*)    Platelets 120 (*)    All  other components within normal limits  URINE CULTURE  TROPONIN I  URINALYSIS, ROUTINE W REFLEX MICROSCOPIC   ____________________________________________  EKG   EKG Interpretation  Date/Time:  Tuesday February 22 2018 12:31:14 EDT Ventricular Rate:  75 PR Interval:  150 QRS Duration: 80 QT Interval:  372 QTC Calculation: 415 R Axis:   85 Text Interpretation:  Sinus rhythm with sinus arrhythmia with occasional Premature ventricular complexes Otherwise normal ECG No STEMI  Confirmed by Nanda Quinton [phone]) on 02/22/2018 12:34:09 PM       ____________________________________________  RADIOLOGY  Ct Head Wo Contrast  Result Date: 02/22/2018 CLINICAL DATA:  Dizziness. EXAM: CT HEAD WITHOUT CONTRAST TECHNIQUE: Contiguous axial images were obtained from the base of the skull through the vertex without intravenous contrast. COMPARISON:  None. FINDINGS: Brain: Mild diffuse cortical atrophy is noted. Minimal chronic ischemic white matter disease is noted. No mass effect or midline shift is noted. Ventricular size is within normal limits. There is no evidence of mass lesion, hemorrhage or acute infarction. Vascular: No hyperdense vessel or unexpected calcification. Skull: Normal. Negative for fracture or focal lesion. Sinuses/Orbits: No acute finding. Other: None. IMPRESSION: Mild diffuse cortical atrophy. Minimal chronic ischemic white matter disease. No acute intracranial abnormality seen. Electronically Signed   By: Marijo Conception, M.D.   On: 02/22/2018 13:27    ____________________________________________   PROCEDURES  Procedure(s) performed:   Procedures  None ____________________________________________   INITIAL IMPRESSION / ASSESSMENT AND PLAN / ED COURSE  Pertinent labs & imaging results that were available during my care of the patient were reviewed by me and considered in my medical decision making (see chart for details).  Patient presents to the emergency department for  evaluation of decreased energy and acute on chronic vertigo symptoms.  Family denies prior work-up for CVA laded to vertigo.  Patient has no focal neurological findings on my exam.  Need for CT imaging of the head, labs, gentle IV fluids, and reassessment.  02:50 PM Patient feeling much better after IVF and meclizine. Labs at patient's baseline. Patient went to the bathroom for urine sample but had urine incontinence prior to giving sample. Plan for in and out after additional PO fluids. CT imaging with no acute findings. Discussed with family that with weeks of intermittent vertigo, my suspicion for central vertigo is very low. No indication at this time for emergent MRI. Family ok with calling PCP for re-evaluation and decision on further outpatient w/u from there.   Care transferred to Dr. Dayna Barker pending UA. Anticipate discharge +/- treatment for UTI if indicated.  ____________________________________________  FINAL CLINICAL IMPRESSION(S) / ED DIAGNOSES  Final diagnoses:  Vertigo  Dehydration     MEDICATIONS GIVEN DURING THIS VISIT:  Medications  sodium chloride 0.9 % bolus 500 mL ( Intravenous Stopped 02/22/18 1443)  meclizine (ANTIVERT) tablet 12.5 mg (12.5 mg Oral Given 02/22/18 1348)     NEW OUTPATIENT MEDICATIONS STARTED DURING THIS VISIT:  New Prescriptions   MECLIZINE (ANTIVERT) 12.5 MG  TABLET    Take 1 tablet (12.5 mg total) by mouth 3 (three) times daily as needed for dizziness.    Note:  This document was prepared using Dragon voice recognition software and may include unintentional dictation errors.  Nanda Quinton, MD Emergency Medicine    Long, Wonda [facility], MD 02/22/18 [phone]

## 2018-02-22 NOTE — Discharge Instructions (Addendum)
We believe your symptoms were caused by benign vertigo.  Please read through the included information and take any prescribed medication(s).  Follow up with your doctor as listed above.  Call your PCP today and make a follow up appointment to discuss if any further testing or specialist referral is required at this time.   If you develop any new or worsening symptoms that concern you, including but not limited to persistent dizziness/vertigo, numbness or weakness in your arms or legs, altered mental status, persistent vomiting, or fever greater than 101, please return immediately to the Emergency Department.

## 2018-02-22 NOTE — ED Notes (Signed)
Unable to catch a urine sample when patient went to restroom.

## 2018-02-22 NOTE — ED Provider Notes (Signed)
3:00 PM Assumed care from Dr. Laverta Baltimore, please see their note for full history, physical and decision making until this point. In brief this is a 84 y.o. year old female who presented to the ED tonight with Dizziness     Vertigo like dizziness. Resolved. Neuro intact. Ct ok. Workup ok, pending UA for discharge.   Discharge instructions, including strict return precautions for new or worsening symptoms, given. Patient and/or family verbalized understanding and agreement with the plan as described.   Labs, studies and imaging reviewed by myself and considered in medical decision making if ordered. Imaging interpreted by radiology.  Labs Reviewed  COMPREHENSIVE METABOLIC PANEL - Abnormal; Notable for the following components:      Result Value   BUN 31 (*)    Creatinine, Ser 1.66 (*)    GFR calc non Af Amer 25 (*)    GFR calc Af Amer 29 (*)    All other components within normal limits  CBC WITH DIFFERENTIAL/PLATELET - Abnormal; Notable for the following components:   RBC 3.53 (*)    Hemoglobin 10.9 (*)    HCT 33.0 (*)    Platelets 120 (*)    All other components within normal limits  URINE CULTURE  TROPONIN I  URINALYSIS, ROUTINE W REFLEX MICROSCOPIC    CT Head Wo Contrast  Final Result      No follow-ups on file.    Merrily Pew, MD 02/23/18 [phone]

## 2018-02-24 LAB — URINE CULTURE
Culture: <10000 — AB
Special Requests: NORMAL

## 2018-02-28 DIAGNOSIS — D508 Other iron deficiency anemias: Secondary | ICD-10-CM | POA: Diagnosis not present

## 2018-03-10 ENCOUNTER — Other Ambulatory Visit: Payer: Self-pay

## 2018-03-10 ENCOUNTER — Encounter (HOSPITAL_BASED_OUTPATIENT_CLINIC_OR_DEPARTMENT_OTHER): Payer: Self-pay | Admitting: *Deleted

## 2018-03-10 ENCOUNTER — Emergency Department (HOSPITAL_BASED_OUTPATIENT_CLINIC_OR_DEPARTMENT_OTHER)
Admission: EM | Admit: 2018-03-10 | Discharge: 2018-03-10 | Disposition: A | Discharge status: Home / Self Care | Payer: Medicare Other | Attending: Emergency Medicine | Admitting: Emergency Medicine

## 2018-03-10 ENCOUNTER — Emergency Department (HOSPITAL_BASED_OUTPATIENT_CLINIC_OR_DEPARTMENT_OTHER): Payer: Medicare Other

## 2018-03-10 DIAGNOSIS — R030 Elevated blood-pressure reading, without diagnosis of hypertension: Secondary | ICD-10-CM

## 2018-03-10 DIAGNOSIS — F0390 Unspecified dementia without behavioral disturbance: Secondary | ICD-10-CM | POA: Insufficient documentation

## 2018-03-10 DIAGNOSIS — E785 Hyperlipidemia, unspecified: Secondary | ICD-10-CM | POA: Diagnosis not present

## 2018-03-10 DIAGNOSIS — R109 Unspecified abdominal pain: Secondary | ICD-10-CM | POA: Diagnosis not present

## 2018-03-10 DIAGNOSIS — R1084 Generalized abdominal pain: Secondary | ICD-10-CM | POA: Diagnosis not present

## 2018-03-10 DIAGNOSIS — Z8551 Personal history of malignant neoplasm of bladder: Secondary | ICD-10-CM | POA: Insufficient documentation

## 2018-03-10 DIAGNOSIS — I10 Essential (primary) hypertension: Secondary | ICD-10-CM | POA: Insufficient documentation

## 2018-03-10 DIAGNOSIS — Z79899 Other long term (current) drug therapy: Secondary | ICD-10-CM | POA: Insufficient documentation

## 2018-03-10 DIAGNOSIS — R112 Nausea with vomiting, unspecified: Secondary | ICD-10-CM | POA: Diagnosis not present

## 2018-03-10 LAB — URINALYSIS, ROUTINE W REFLEX MICROSCOPIC
Bilirubin Urine: NEGATIVE
GLUCOSE, UA: NEGATIVE mg/dL
Hgb urine dipstick: NEGATIVE
KETONES UR: NEGATIVE mg/dL
LEUKOCYTES UA: NEGATIVE
NITRITE: NEGATIVE
PROTEIN: NEGATIVE mg/dL
Specific Gravity, Urine: <1.005 — ABNORMAL LOW (ref 1.005–1.030)
pH: 5 (ref 5.0–8.0)

## 2018-03-10 LAB — CBC WITH DIFFERENTIAL/PLATELET
Basophils Absolute: 0 10*3/uL (ref 0.0–0.1)
Basophils Relative: 1 %
EOS ABS: 0.1 10*3/uL (ref 0.0–0.7)
Eosinophils Relative: 2 %
HEMATOCRIT: 32.8 % — AB (ref 36.0–46.0)
HEMOGLOBIN: 10.6 g/dL — AB (ref 12.0–15.0)
LYMPHS ABS: 0.9 10*3/uL (ref 0.7–4.0)
LYMPHS PCT: 22 %
MCH: 30.2 pg (ref 26.0–34.0)
MCHC: 32.3 g/dL (ref 30.0–36.0)
MCV: 93.4 fL (ref 78.0–100.0)
MONOS PCT: 10 %
Monocytes Absolute: 0.4 10*3/uL (ref 0.1–1.0)
NEUTROS ABS: 2.5 10*3/uL (ref 1.7–7.7)
NEUTROS PCT: 65 %
Platelets: 121 10*3/uL — ABNORMAL LOW (ref 150–400)
RBC: 3.51 MIL/uL — ABNORMAL LOW (ref 3.87–5.11)
RDW: 12.4 % (ref 11.5–15.5)
WBC: 3.9 10*3/uL — ABNORMAL LOW (ref 4.0–10.5)

## 2018-03-10 LAB — COMPREHENSIVE METABOLIC PANEL
ALBUMIN: 3.9 g/dL (ref 3.5–5.0)
ALK PHOS: 54 U/L (ref 38–126)
ALT: 14 U/L (ref 0–44)
ANION GAP: 9 (ref 5–15)
AST: 20 U/L (ref 15–41)
BUN: 31 mg/dL — ABNORMAL HIGH (ref 8–23)
CALCIUM: 9.3 mg/dL (ref 8.9–10.3)
CO2: 30 mmol/L (ref 22–32)
Chloride: 100 mmol/L (ref 98–111)
Creatinine, Ser: 1.76 mg/dL — ABNORMAL HIGH (ref 0.44–1.00)
GFR calc Af Amer: 27 mL/min — ABNORMAL LOW (ref >60)
GFR calc non Af Amer: 24 mL/min — ABNORMAL LOW (ref >60)
GLUCOSE: 83 mg/dL (ref 70–99)
Potassium: 3.8 mmol/L (ref 3.5–5.1)
SODIUM: 139 mmol/L (ref 135–145)
Total Bilirubin: 1.1 mg/dL (ref 0.3–1.2)
Total Protein: 6.6 g/dL (ref 6.5–8.1)

## 2018-03-10 LAB — LIPASE, BLOOD: Lipase: 42 U/L (ref 11–51)

## 2018-03-10 MED ORDER — SODIUM CHLORIDE 0.9 % IV SOLN: INTRAVENOUS | Status: DC

## 2018-03-10 NOTE — ED Triage Notes (Addendum)
Daughter states abd pain n/v  X 1 day also c/o h/a

## 2018-03-10 NOTE — ED Provider Notes (Signed)
[place] EMERGENCY DEPARTMENT Provider Note   CSN: [id number] Arrival date & time: 03/10/18  1340     History   Chief Complaint Chief Complaint  Patient presents with  . Abdominal Pain    HPI Pamela Woods is a 84 y.o. female.  84 year old female with history of dementia, hypertension, high cholesterol, bladder cancer in by her daughter from home with complaint of abdominal pain, headache, nausea, vomiting.  Patient states that she woke up this morning feeling well, ate her normal breakfast, around noon began to feel unwell and went to lay down.  Per daughter, patient got up at 1:00 and said that she was feeling terrible with a headache, abdominal pain and had one episode of emesis (clear liquid).  Patient was then brought to the emergency room where she had one additional episode of emesis.  Patient denies pain in her head or abdomen at this time, reports normal bowel and bladder habits, denies fevers, chills, shortness of breath or chest pain.  Patient states that this time she feels ill, unable to specify.  Previous abdominal surgeries include cholecystectomy, partial hysterectomy.  Patient does not currently undergoing treatment for her bladder cancer.  Patient did take all of her morning medications including her blood pressure medication, denies any missed medication doses.     Past Medical History:  Diagnosis Date  . Bladder cancer ([place])   . Dementia   . Hyperlipidemia   . Hypertension     There are no active problems to display for this patient.   Past Surgical History:  Procedure Laterality Date  . CHOLECYSTECTOMY    . RADICAL HYSTERECTOMY WITH TRANSPOSITION OF OVARIES Bilateral    removal of ovaries     OB History   None      Home Medications    Prior to Admission medications   Medication Sig Start Date End Date Taking? Authorizing Provider  amLODipine (NORVASC) 5 MG tablet Take 5 mg by mouth daily. 06/27/15   [provider]    carvedilol (COREG) 12.5 MG tablet Take 12.5 mg by mouth daily.    [provider]  cholecalciferol (VITAMIN D) 1000 UNITS tablet Take 1,000 Units by mouth daily.    [provider]  meclizine (ANTIVERT) 12.5 MG tablet Take 1 tablet (12.5 mg total) by mouth 3 (three) times daily as needed for dizziness. 02/22/18   Long, Wonda Olds, MD  olmesartan-hydrochlorothiazide (BENICAR HCT) 20-12.5 MG tablet Take 1 tablet by mouth daily. 06/27/15   [provider]  ondansetron (ZOFRAN ODT) 4 MG disintegrating tablet 4mg  ODT q4 hours prn nausea/vomit 04/02/17   Julianne Rice, MD  pantoprazole (PROTONIX) 20 MG tablet Take 1 tablet (20 mg total) by mouth daily. 04/02/17   Julianne Rice, MD  ranitidine (ZANTAC) 150 MG tablet Take 150 mg by mouth daily.     [provider]  vitamin C (ASCORBIC ACID) 500 MG tablet Take 500 mg by mouth daily.    [provider]    Family History History reviewed. No pertinent family history.  Social History Social History   Tobacco Use  . Smoking status: Never Smoker  . Smokeless tobacco: Never Used  Substance Use Topics  . Alcohol use: Never    Frequency: Never  . Drug use: Never     Allergies   Ampicillin; Cephalosporins; Doxycycline; Nitrofurantoin; Relafen [nabumetone]; and Sulfur   Review of Systems Review of Systems  Unable to perform ROS: Dementia  Constitutional: Negative for chills and fever.  Respiratory: Negative for shortness of breath.   Cardiovascular: Negative for chest pain.  Gastrointestinal: Positive for abdominal pain, nausea and vomiting. Negative for blood in stool, constipation and diarrhea.  Genitourinary: Negative for dysuria, frequency and urgency.  Skin: Negative for color change and pallor.  Neurological: Positive for headaches.     Physical Exam Updated Vital Signs BP (!) 192/73   Pulse 60   Temp 98.2 F (36.8 C)   Resp (!) 21   Ht 5\' 2"  (1.575 m)   SpO2 100%   BMI 18.29 kg/m    Physical Exam  Constitutional: She appears well-developed and well-nourished.  Non-toxic appearance. No distress.  HENT:  Head: Normocephalic and atraumatic.  Cardiovascular: Normal rate, regular rhythm and intact distal pulses.  Pulmonary/Chest: Effort normal and breath sounds normal.  Abdominal: Normal appearance and bowel sounds are normal. She exhibits no distension. There is no tenderness.  Neurological: She is alert.  Skin: Skin is warm and dry. She is not diaphoretic.  Psychiatric: She has a normal mood and affect. Her behavior is normal.  Nursing note and vitals reviewed.    ED Treatments / Results  Labs (all labs ordered are listed, but only abnormal results are displayed) Labs Reviewed  URINALYSIS, ROUTINE W REFLEX MICROSCOPIC - Abnormal; Notable for the following components:      Result Value   Specific Gravity, Urine <1.005 (*)    All other components within normal limits  COMPREHENSIVE METABOLIC PANEL - Abnormal; Notable for the following components:   BUN 31 (*)    Creatinine, Ser 1.76 (*)    GFR calc non Af Amer 24 (*)    GFR calc Af Amer 27 (*)    All other components within normal limits  CBC WITH DIFFERENTIAL/PLATELET - Abnormal; Notable for the following components:   WBC 3.9 (*)    RBC 3.51 (*)    Hemoglobin 10.6 (*)    HCT 32.8 (*)    Platelets 121 (*)    All other components within normal limits  LIPASE, BLOOD    EKG EKG Interpretation  Date/Time:  Thursday March 10 2018 14:30:48 EDT Ventricular Rate:  57 PR Interval:    QRS Duration: 92 QT Interval:  450 QTC Calculation: 439 R Axis:   58 Text Interpretation:  Sinus rhythm Borderline short PR interval Minimal ST elevation, inferior leads wandering baseline Confirmed by Gerlene Fee [phone]) on 03/10/2018 3:33:20 PM   Radiology Dg Abd Acute W/chest  Result Date: 03/10/2018 CLINICAL DATA:  Abdominal pain.  Cystectomy. EXAM: DG ABDOMEN ACUTE W/ 1V CHEST COMPARISON:  04/02/2017. FINDINGS: No  acute cardiopulmonary disease. Soft tissue structures in the abdomen are unremarkable. Air-filled loops of small and large bowel are noted. A very mild adynamic ileus cannot be excluded. Surgical clips right upper quadrant. Calcifications are noted right upper quadrant. These could represent renal stones. By history the patient has had a prior cholecystectomy. Degenerative change lumbar spine and both hips. No acute bone abnormality. IMPRESSION: 1. Calcifications noted over the right upper quadrant. These could represent kidney stones. By history patient has had a prior cholecystectomy. 2. Nondistended air-filled loops of small and large bowel. This is a nonspecific finding. A very mild adynamic ileus cannot be excluded. Electronically Signed   By: Marcello Moores  Register   On: 03/10/2018 15:14    Procedures Procedures (including critical care time)  Medications Ordered in ED Medications  0.9 %  sodium chloride infusion (has no administration in time range)     Initial Impression /  Assessment and Plan / ED Course  I have reviewed the triage vital signs and the nursing notes.  Pertinent labs & imaging results that were available during my care of the patient were reviewed by me and considered in my medical decision making (see chart for details).  Clinical Course as of Mar 10 1637  Thu Mar 10, 2018  1500 84yo female brought in by daughter from home for complaint of abdominal pain, headache, nausea/vomiting onset 2 hours PTA. Upon arrival, patient states abdominal pain and headache had resolved, continued to feel "unwell." Abdomen soft and non tender, appears to feel unwell, exam otherwise unremarkable. Labs and xray chest/abdomen ordered as well as EKG to evaluate for arrhythmia, dehydration, metabolic abnormality, infection/anemia.   [LM]  1530 Repeat abdominal exam, abdomen is soft and non tender, patient states she is feeling better, no complaints at this time. Review of labs, Unchanged compared to  previous, most recent end of July 2019, known anemia, CKD. Lipase WNL, UA unremarkable.    [LM]  1636 Repeat abdominal exam, soft and non tender, patient continues to look well and states she is feeling better. Patient ambulatory to the bathroom with assistance, ambulates well. Discussed results with patient and her daughter, all in agreement to dc home, recheck with PCP, return to ER should any symptoms return. Monitor BP at home and take to follow up. Patient also seen by Dr. Sedonia Small, ER attending while in the ER today who agrees with plan, EKG read- no acute changes.    [LM]    Clinical Course User Index [LM] Tacy Learn, PA-C    Final Clinical Impressions(s) / ED Diagnoses   Final diagnoses:  Generalized abdominal pain  Non-intractable vomiting with nausea, unspecified vomiting type  Elevated blood pressure reading    ED Discharge Orders    None       Tacy Learn, PA-C 03/10/18 1638    Maudie Flakes, MD 03/12/18 [phone]

## 2018-03-10 NOTE — ED Notes (Signed)
Delay in xray, RN with pt

## 2018-03-10 NOTE — Discharge Instructions (Addendum)
Return to the emergency room for any worsening or return of symptoms.  Contact your doctor's office tomorrow to arrange follow-up appointment in the next week. Monitor blood pressure at home and follow-up with your doctor regarding elevated blood pressure reading today.

## 2018-03-10 NOTE — ED Notes (Signed)
ED Provider at bedside. 

## 2018-03-10 NOTE — ED Notes (Signed)
Pt/family verbalized understanding of discharge instructions.   

## 2018-04-13 ENCOUNTER — Encounter: Payer: Self-pay | Admitting: Podiatry

## 2018-04-13 ENCOUNTER — Ambulatory Visit (INDEPENDENT_AMBULATORY_CARE_PROVIDER_SITE_OTHER): Payer: Medicare Other | Admitting: Podiatry

## 2018-04-13 DIAGNOSIS — M79674 Pain in right toe(s): Secondary | ICD-10-CM | POA: Diagnosis not present

## 2018-04-13 DIAGNOSIS — M79675 Pain in left toe(s): Secondary | ICD-10-CM | POA: Diagnosis not present

## 2018-04-13 DIAGNOSIS — B351 Tinea unguium: Secondary | ICD-10-CM

## 2018-04-13 NOTE — Progress Notes (Addendum)
Patient ID: SHAKESHA SOLTAU, female   DOB: 07/08/1922, 84 y.o.   MRN: [id number] Complaint:  Visit Type: Patient returns to my office for continued preventative foot care services. Complaint: Patient states" my nails have grown long and thick and become painful to walk and wear shoes" . The patient presents for preventative foot care services. No changes to ROS.  She admits to throbbing pain in right toe inside border.  Podiatric Exam: Vascular: dorsalis pedis and posterior tibial pulses are palpable bilateral. Capillary return is immediate. Temperature gradient is WNL. Skin turgor WNL  Sensorium: Normal Semmes Weinstein monofilament test. Normal tactile sensation bilaterally. Nail Exam: Pt has thick disfigured discolored nails with subungual debris noted bilateral entire nail hallux . Ulcer Exam: There is no evidence of ulcer or pre-ulcerative changes or infection.  Pincer nails  B/L Orthopedic Exam: Muscle tone and strength are WNL. No limitations in general ROM. No crepitus or effusions noted. Foot type and digits show no abnormalities. Bony prominences are unremarkable. Skin: No Porokeratosis. No infection or ulcers  Diagnosis:  Onychomycosis, , Pain in right toe, pain in left toes  Treatment & Plan Procedures and Treatment: Consent by patient was obtained for treatment procedures. The patient understood the discussion of treatment and procedures well. All questions were answered thoroughly reviewed. Debridement of mycotic and hypertrophic toenails, 1 through 5 bilateral and clearing of subungual debris. No ulceration, no infection noted.  Return Visit-Office Procedure: Patient instructed to return to the office for a follow up visit 9 weeks  for continued evaluation and treatment.    Gardiner Barefoot DPM

## 2018-04-26 DIAGNOSIS — H6123 Impacted cerumen, bilateral: Secondary | ICD-10-CM | POA: Diagnosis not present

## 2018-04-26 DIAGNOSIS — H9193 Unspecified hearing loss, bilateral: Secondary | ICD-10-CM | POA: Insufficient documentation

## 2018-04-26 DIAGNOSIS — H903 Sensorineural hearing loss, bilateral: Secondary | ICD-10-CM | POA: Diagnosis not present

## 2018-05-11 IMAGING — CR DG ABDOMEN ACUTE W/ 1V CHEST
3 series · 3 of 3 positions shown · non-contrast
Comparison: 05/01/2011.  04/14/2011.

CLINICAL DATA: Nausea and vomiting, 2 weeks duration.

EXAM:
DG ABDOMEN ACUTE W/ 1V CHEST

[w chest pa]
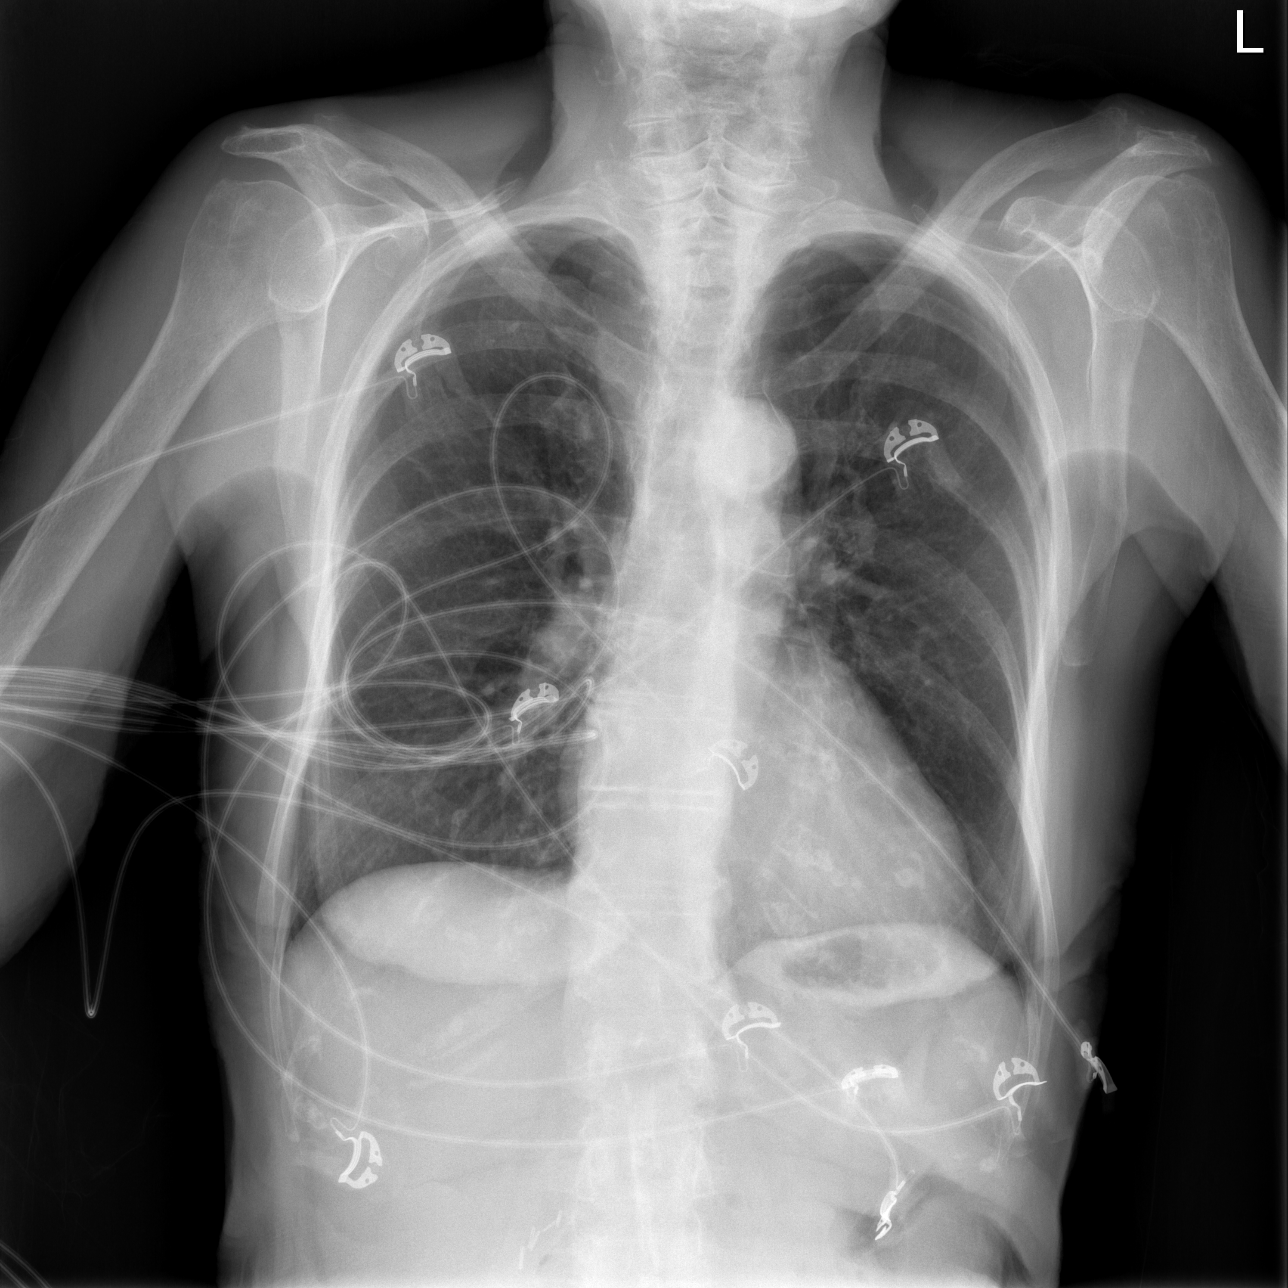

[w abdomen upright]
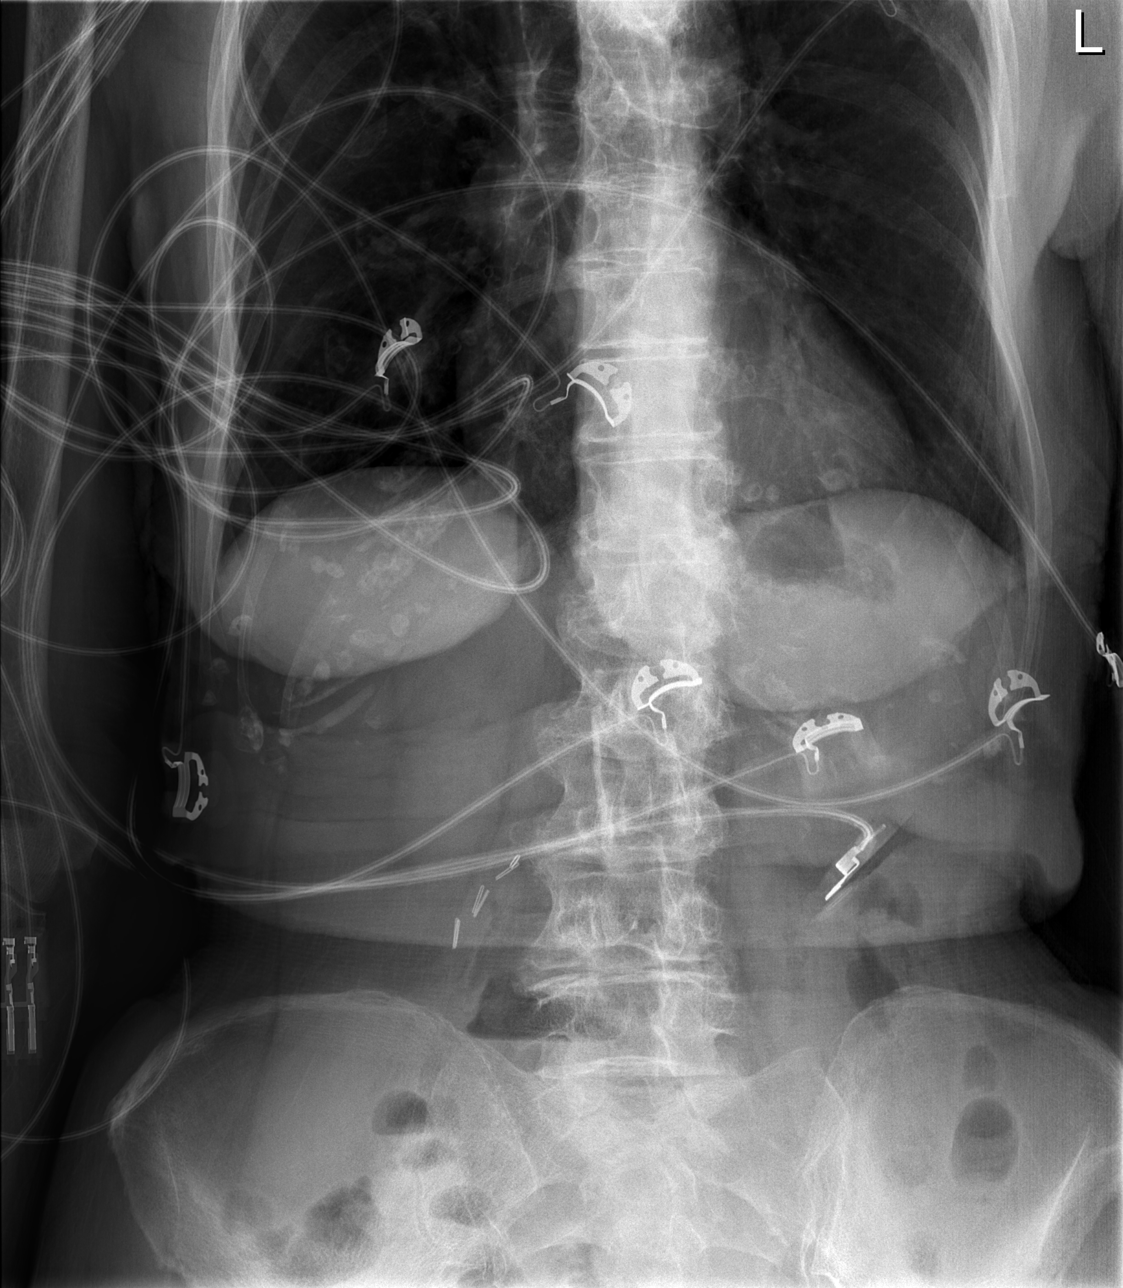

[t abdomen supine]
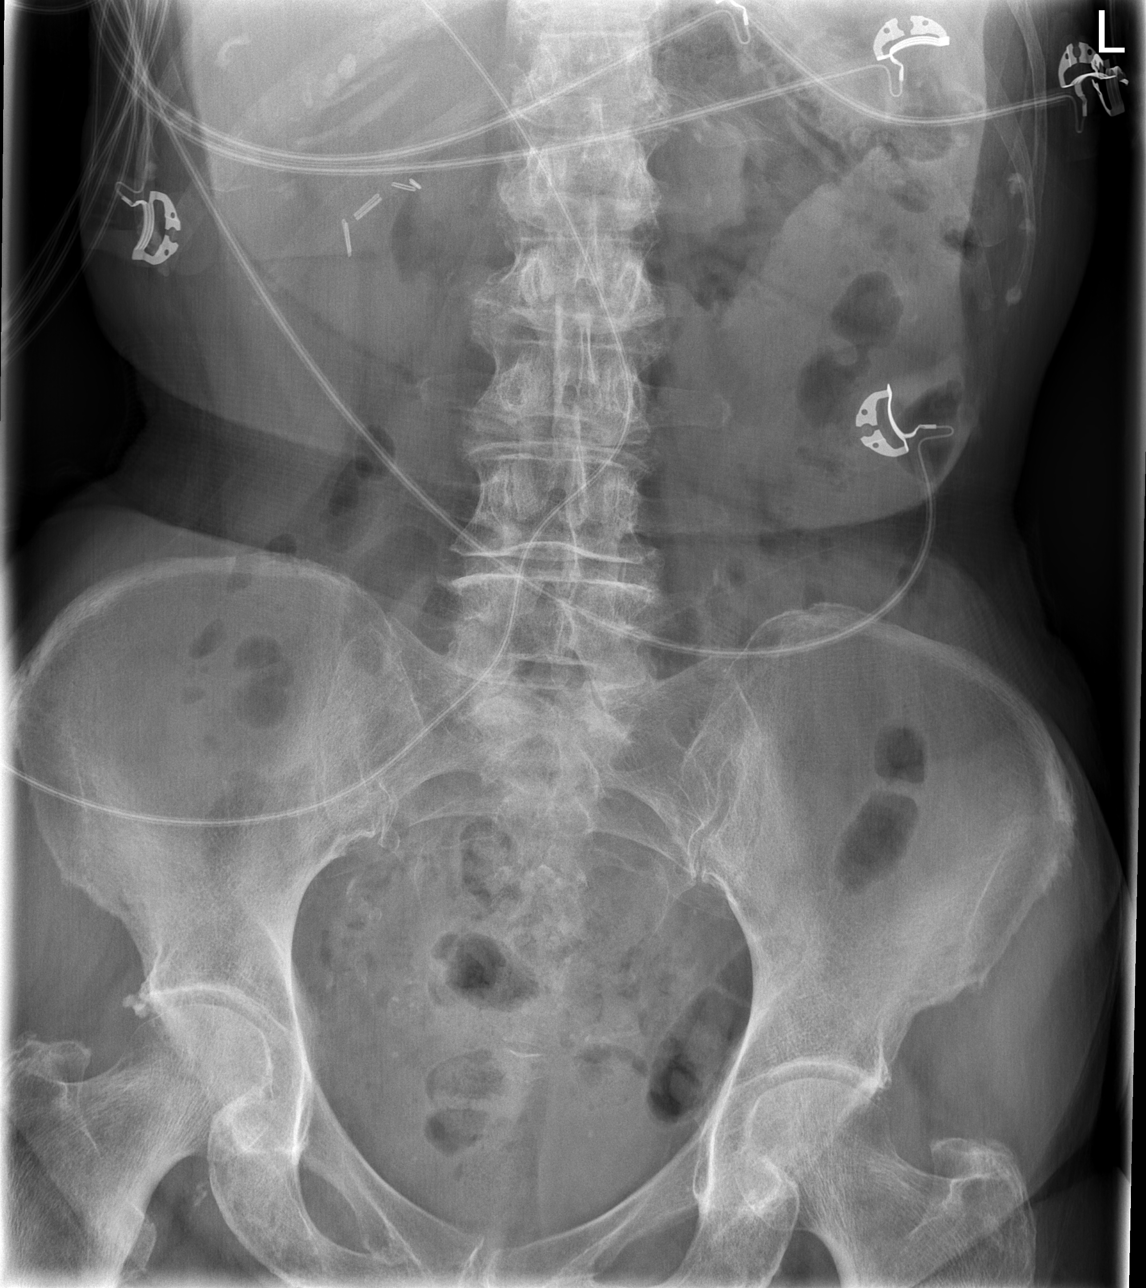

[3 of 3 positions shown; findings below may reference images not displayed]

FINDINGS: Bowel gas pattern is normal without evidence of ileus, obstruction
or free air. Clips in the right upper quadrant consistent with
previous cholecystectomy. No worrisome calcifications or bone
findings.

One-view chest shows normal heart shadow. Aortic atherosclerosis.
Lungs are clear. The vascularity is normal. No effusions. No free
air under the diaphragm.
IMPRESSION: Negative abdominal radiographs.  No acute cardiopulmonary disease.

## 2018-05-13 DIAGNOSIS — Z23 Encounter for immunization: Secondary | ICD-10-CM | POA: Diagnosis not present

## 2018-05-31 DIAGNOSIS — D508 Other iron deficiency anemias: Secondary | ICD-10-CM | POA: Diagnosis not present

## 2018-06-15 DIAGNOSIS — H04123 Dry eye syndrome of bilateral lacrimal glands: Secondary | ICD-10-CM | POA: Diagnosis not present

## 2018-06-15 DIAGNOSIS — H524 Presbyopia: Secondary | ICD-10-CM | POA: Diagnosis not present

## 2018-06-15 DIAGNOSIS — H43813 Vitreous degeneration, bilateral: Secondary | ICD-10-CM | POA: Diagnosis not present

## 2018-06-15 DIAGNOSIS — H353131 Nonexudative age-related macular degeneration, bilateral, early dry stage: Secondary | ICD-10-CM | POA: Diagnosis not present

## 2018-06-21 ENCOUNTER — Ambulatory Visit (INDEPENDENT_AMBULATORY_CARE_PROVIDER_SITE_OTHER): Payer: Medicare Other | Admitting: Podiatry

## 2018-06-21 ENCOUNTER — Encounter: Payer: Self-pay | Admitting: Podiatry

## 2018-06-21 DIAGNOSIS — M79675 Pain in left toe(s): Secondary | ICD-10-CM | POA: Diagnosis not present

## 2018-06-21 DIAGNOSIS — M79674 Pain in right toe(s): Secondary | ICD-10-CM | POA: Diagnosis not present

## 2018-06-21 DIAGNOSIS — B351 Tinea unguium: Secondary | ICD-10-CM

## 2018-06-21 DIAGNOSIS — L608 Other nail disorders: Secondary | ICD-10-CM

## 2018-06-21 NOTE — Progress Notes (Signed)
Patient ID: Pamela Woods, female   DOB: Mar 06, 1922, 84 y.o.   MRN: [id number] Complaint:  Visit Type: Patient returns to my office for continued preventative foot care services. Complaint: Patient states" my nails have grown long and thick and become painful to walk and wear shoes" . The patient presents for preventative foot care services. No changes to ROS.  She admits to throbbing pain in right toe inside border.  Podiatric Exam: Vascular: dorsalis pedis and posterior tibial pulses are palpable bilateral. Capillary return is immediate. Temperature gradient is WNL. Skin turgor WNL  Sensorium: Normal Semmes Weinstein monofilament test. Normal tactile sensation bilaterally. Nail Exam: Pt has thick disfigured discolored nails with subungual debris noted bilateral entire nail hallux . Ulcer Exam: There is no evidence of ulcer or pre-ulcerative changes or infection.  Pincer nails  B/L Orthopedic Exam: Muscle tone and strength are WNL. No limitations in general ROM. No crepitus or effusions noted. Foot type and digits show no abnormalities. Bony prominences are unremarkable. Skin: No Porokeratosis. No infection or ulcers  Diagnosis:  Onychomycosis, , Pain in right toe, pain in left toes  Treatment & Plan Procedures and Treatment: Consent by patient was obtained for treatment procedures. The patient understood the discussion of treatment and procedures well. All questions were answered thoroughly reviewed. Debridement of mycotic and hypertrophic toenails, 1 through 5 bilateral and clearing of subungual debris. No ulceration, no infection noted.  Return Visit-Office Procedure: Patient instructed to return to the office for a follow up visit 9 weeks  for continued evaluation and treatment.    Gardiner Barefoot DPM

## 2018-06-28 DIAGNOSIS — E7800 Pure hypercholesterolemia, unspecified: Secondary | ICD-10-CM | POA: Diagnosis not present

## 2018-06-28 DIAGNOSIS — I129 Hypertensive chronic kidney disease with stage 1 through stage 4 chronic kidney disease, or unspecified chronic kidney disease: Secondary | ICD-10-CM | POA: Diagnosis not present

## 2018-06-28 DIAGNOSIS — K219 Gastro-esophageal reflux disease without esophagitis: Secondary | ICD-10-CM | POA: Diagnosis not present

## 2018-06-28 DIAGNOSIS — N184 Chronic kidney disease, stage 4 (severe): Secondary | ICD-10-CM | POA: Diagnosis not present

## 2018-06-29 ENCOUNTER — Emergency Department (HOSPITAL_BASED_OUTPATIENT_CLINIC_OR_DEPARTMENT_OTHER): Payer: Medicare Other

## 2018-06-29 ENCOUNTER — Encounter (HOSPITAL_BASED_OUTPATIENT_CLINIC_OR_DEPARTMENT_OTHER): Payer: Self-pay | Admitting: Emergency Medicine

## 2018-06-29 ENCOUNTER — Other Ambulatory Visit: Payer: Self-pay

## 2018-06-29 ENCOUNTER — Emergency Department (HOSPITAL_BASED_OUTPATIENT_CLINIC_OR_DEPARTMENT_OTHER)
Admission: EM | Admit: 2018-06-29 | Discharge: 2018-06-29 | Disposition: A | Discharge status: Home / Self Care | Payer: Medicare Other | Attending: Emergency Medicine | Admitting: Emergency Medicine

## 2018-06-29 DIAGNOSIS — M549 Dorsalgia, unspecified: Secondary | ICD-10-CM | POA: Diagnosis present

## 2018-06-29 DIAGNOSIS — Z8551 Personal history of malignant neoplasm of bladder: Secondary | ICD-10-CM | POA: Insufficient documentation

## 2018-06-29 DIAGNOSIS — I10 Essential (primary) hypertension: Secondary | ICD-10-CM | POA: Insufficient documentation

## 2018-06-29 DIAGNOSIS — R079 Chest pain, unspecified: Secondary | ICD-10-CM | POA: Diagnosis not present

## 2018-06-29 DIAGNOSIS — R0789 Other chest pain: Secondary | ICD-10-CM | POA: Insufficient documentation

## 2018-06-29 DIAGNOSIS — F0390 Unspecified dementia without behavioral disturbance: Secondary | ICD-10-CM | POA: Insufficient documentation

## 2018-06-29 DIAGNOSIS — Z79899 Other long term (current) drug therapy: Secondary | ICD-10-CM | POA: Diagnosis not present

## 2018-06-29 DIAGNOSIS — Z9049 Acquired absence of other specified parts of digestive tract: Secondary | ICD-10-CM | POA: Insufficient documentation

## 2018-06-29 LAB — COMPREHENSIVE METABOLIC PANEL
ALT: 12 U/L (ref 0–44)
AST: 20 U/L (ref 15–41)
Albumin: 3.9 g/dL (ref 3.5–5.0)
Alkaline Phosphatase: 53 U/L (ref 38–126)
Anion gap: 7 (ref 5–15)
BUN: 36 mg/dL — ABNORMAL HIGH (ref 8–23)
CO2: 29 mmol/L (ref 22–32)
Calcium: 9.5 mg/dL (ref 8.9–10.3)
Chloride: 100 mmol/L (ref 98–111)
Creatinine, Ser: 1.47 mg/dL — ABNORMAL HIGH (ref 0.44–1.00)
GFR calc Af Amer: 35 mL/min — ABNORMAL LOW (ref >60)
GFR calc non Af Amer: 30 mL/min — ABNORMAL LOW (ref >60)
Glucose, Bld: 94 mg/dL (ref 70–99)
Potassium: 4.1 mmol/L (ref 3.5–5.1)
Sodium: 136 mmol/L (ref 135–145)
Total Bilirubin: 1.3 mg/dL — ABNORMAL HIGH (ref 0.3–1.2)
Total Protein: 6.8 g/dL (ref 6.5–8.1)

## 2018-06-29 LAB — D-DIMER, QUANTITATIVE: D-Dimer, Quant: 0.98 ug/mL-FEU — ABNORMAL HIGH (ref 0.00–0.50)

## 2018-06-29 LAB — CBC
HCT: 34.5 % — ABNORMAL LOW (ref 36.0–46.0)
Hemoglobin: 10.8 g/dL — ABNORMAL LOW (ref 12.0–15.0)
MCH: 30.1 pg (ref 26.0–34.0)
MCHC: 31.3 g/dL (ref 30.0–36.0)
MCV: 96.1 fL (ref 80.0–100.0)
NRBC: 0 % (ref 0.0–0.2)
PLATELETS: 132 10*3/uL — AB (ref 150–400)
RBC: 3.59 MIL/uL — AB (ref 3.87–5.11)
RDW: 12.6 % (ref 11.5–15.5)
WBC: 4.4 10*3/uL (ref 4.0–10.5)

## 2018-06-29 LAB — TROPONIN I: Troponin I: <0.03 ng/mL (ref <0.03)

## 2018-06-29 MED ORDER — LABETALOL HCL 5 MG/ML IV SOLN
10.0000 mg | Freq: Once | INTRAVENOUS | Status: AC
  Administered 2018-06-29: 10 mg via INTRAVENOUS
  Filled 2018-06-29: qty 4

## 2018-06-29 NOTE — ED Triage Notes (Signed)
Reports upper mid and left sided back pain which began this morning.  States the pain comes across the front of her chest. Reports intermittent shortness of breath.  Denies nausea, dizziness.

## 2018-06-29 NOTE — ED Provider Notes (Signed)
[facility] EMERGENCY DEPARTMENT Provider Note   CSN: [id number] Arrival date & time: 06/29/18  1018     History   Chief Complaint Chief Complaint  Patient presents with  . Back Pain    HPI Pamela Woods is a 84 y.o. female.  Pt presents to the ED today with mid and left sided back pain.  It radiated around to the front of her chest and hurts with movement.  The pt denies n/v or dizziness.  The pt denies any pain now.  Pt did go to her pcp yesterday for a routine visit and everything was ok according to the daughter.  The pt is a poor historian, and daughter gives most of the hx.     Past Medical History:  Diagnosis Date  . Bladder cancer ([place])   . Dementia ([employer])   . Hyperlipidemia   . Hypertension     There are no active problems to display for this patient.   Past Surgical History:  Procedure Laterality Date  . CHOLECYSTECTOMY    . RADICAL HYSTERECTOMY WITH TRANSPOSITION OF OVARIES Bilateral    removal of ovaries     OB History   None      Home Medications    Prior to Admission medications   Medication Sig Start Date End Date Taking? Authorizing Provider  atorvastatin (LIPITOR) 20 MG tablet Take 20 mg by mouth daily. 03/06/18   [provider]  cholecalciferol (VITAMIN D) 1000 UNITS tablet Take 1,000 Units by mouth daily.    [provider]  Cyanocobalamin (VITAMIN B-12 PO) Take by mouth.    [provider]  furosemide (LASIX) 20 MG tablet TAKE 1/2 TABLET ONCE A DAY 02/08/18   [provider]  IRON CR PO Take by mouth.    [provider]  Multiple Vitamins-Minerals (PRESERVISION AREDS PO) Take by mouth.    [provider]  olmesartan (BENICAR) 20 MG tablet Take 20 mg by mouth daily. 01/08/18   [provider]  pantoprazole (PROTONIX) 40 MG tablet Take 40 mg by mouth daily. 04/07/18   [provider]  ranitidine (ZANTAC) 150 MG tablet Take 150 mg by mouth daily.      [provider]  vitamin C (ASCORBIC ACID) 500 MG tablet Take 500 mg by mouth daily.    [provider]    Family History History reviewed. No pertinent family history.  Social History Social History   Tobacco Use  . Smoking status: Never Smoker  . Smokeless tobacco: Never Used  Substance Use Topics  . Alcohol use: Never    Frequency: Never  . Drug use: Never     Allergies   Ampicillin; Cephalosporins; Doxycycline; Nabumetone; Nitrofurantoin; Other; and Sulfur   Review of Systems Review of Systems  Cardiovascular: Positive for chest pain.  Musculoskeletal: Positive for back pain.  All other systems reviewed and are negative.    Physical Exam Updated Vital Signs BP (!) 179/63 (BP Location: Right Arm)   Pulse (!) 58   Temp 98.1 F (36.7 C) (Oral)   Resp 18   Ht 5\' 2"  (1.575 m)   Wt 44 kg   SpO2 100%   BMI 17.74 kg/m   Physical Exam  Constitutional: She is oriented to person, place, and time. She appears well-developed and well-nourished.  HENT:  Head: Normocephalic and atraumatic.  Right Ear: External ear normal.  Left Ear: External ear normal.  Nose: Nose normal.  Mouth/Throat: Oropharynx is clear and moist.  Eyes: Pupils are equal, round, and reactive to light. Conjunctivae and EOM are normal.  Neck: Normal range of motion. Neck supple.  Cardiovascular: Normal rate, regular rhythm, normal heart sounds and intact distal pulses.  Pulmonary/Chest: Effort normal and breath sounds normal.  Abdominal: Soft. Bowel sounds are normal.  Musculoskeletal: Normal range of motion.  Neurological: She is alert and oriented to person, place, and time.  Skin: Skin is warm and dry. Capillary refill takes less than 2 seconds.  Psychiatric: She has a normal mood and affect. Her behavior is normal. Judgment and thought content normal.  Nursing note and vitals reviewed.    ED Treatments / Results  Labs (all labs ordered are listed, but only abnormal  results are displayed) Labs Reviewed  CBC - Abnormal; Notable for the following components:      Result Value   RBC 3.59 (*)    Hemoglobin 10.8 (*)    HCT 34.5 (*)    Platelets 132 (*)    All other components within normal limits  COMPREHENSIVE METABOLIC PANEL - Abnormal; Notable for the following components:   BUN 36 (*)    Creatinine, Ser 1.47 (*)    Total Bilirubin 1.3 (*)    GFR calc non Af Amer 30 (*)    GFR calc Af Amer 35 (*)    All other components within normal limits  D-DIMER, QUANTITATIVE (NOT AT [facility]) - Abnormal; Notable for the following components:   D-Dimer, Quant 0.98 (*)    All other components within normal limits  TROPONIN I  TROPONIN I    EKG EKG Interpretation  Date/Time:  Wednesday June 29 2018 10:33:17 EST Ventricular Rate:  75 PR Interval:    QRS Duration: 89 QT Interval:  394 QTC Calculation: 441 R Axis:   26 Text Interpretation:  Sinus arrhythmia No significant change since last tracing Confirmed by Isla Pence [phone]) on 06/29/2018 10:46:50 AM   Radiology Dg Chest 2 View  Result Date: 06/29/2018 CLINICAL DATA:  84 year old female with back pain radiating to the chest this morning. EXAM: CHEST - 2 VIEW COMPARISON:  Chest radiographs 03/10/2018 and earlier. FINDINGS: Chronic Calcified aortic atherosclerosis. Lung volumes are stable at the upper limits of normal to mildly hyperinflated. Normal cardiac size and mediastinal contours. Visualized tracheal air column is within normal limits. Both lungs appear clear. No pneumothorax or pleural effusion. Osteopenia. No acute osseous abnormality identified. Stable cholecystectomy clips. Negative visible bowel gas pattern. IMPRESSION: 1. No acute cardiopulmonary abnormality. Chronic borderline to mild hyperinflation. 2.  Aortic Atherosclerosis (ICD10-I70.0). Electronically Signed   By: Genevie Ann M.D.   On: 06/29/2018 11:30    Procedures Procedures (including critical care time)  Medications Ordered in  ED Medications  labetalol (NORMODYNE,TRANDATE) injection 10 mg (10 mg Intravenous Given 06/29/18 1245)     Initial Impression / Assessment and Plan / ED Course  I have reviewed the triage vital signs and the nursing notes.  Pertinent labs & imaging results that were available during my care of the patient were reviewed by me and considered in my medical decision making (see chart for details).    Age adjusted Ddimer is negative.  Pt is hypertensive here, with a low bp yesterday at the doctor's office.  Her blood pressures here for the last few months have been high.  Daughter is told to get a bp cuff and check it daily.  I am not going to start pt on additional bp meds now since bp low yesterday.  CP  is very atypical.  I thought about dissection, but cp lasted a short time and she has not had any for several hours.  2 negative troponins with a negative EKG.  I think pt is ok to go home.  Pt knows to f/u with pcp.  Final Clinical Impressions(s) / ED Diagnoses   Final diagnoses:  Atypical chest pain  Essential hypertension    ED Discharge Orders    None       Isla Pence, MD 06/29/18 1327

## 2018-08-02 DIAGNOSIS — D508 Other iron deficiency anemias: Secondary | ICD-10-CM | POA: Diagnosis not present

## 2018-08-26 ENCOUNTER — Ambulatory Visit (INDEPENDENT_AMBULATORY_CARE_PROVIDER_SITE_OTHER): Payer: Medicare Other | Admitting: Podiatry

## 2018-08-26 ENCOUNTER — Encounter: Payer: Self-pay | Admitting: Podiatry

## 2018-08-26 DIAGNOSIS — B351 Tinea unguium: Secondary | ICD-10-CM

## 2018-08-26 DIAGNOSIS — M79675 Pain in left toe(s): Secondary | ICD-10-CM

## 2018-08-26 DIAGNOSIS — L608 Other nail disorders: Secondary | ICD-10-CM

## 2018-08-26 DIAGNOSIS — M79674 Pain in right toe(s): Secondary | ICD-10-CM

## 2018-08-26 NOTE — Progress Notes (Signed)
Patient ID: IVOREE FELMLEE, female   DOB: 16-Nov-1921, 85 y.o.   MRN: [id number] Complaint:  Visit Type: Patient returns to my office for continued preventative foot care services. Complaint: Patient states" my nails have grown long and thick and become painful to walk and wear shoes" . The patient presents for preventative foot care services. No changes to ROS.    Podiatric Exam: Vascular: dorsalis pedis and posterior tibial pulses are palpable bilateral. Capillary return is immediate. Temperature gradient is WNL. Skin turgor WNL  Sensorium: Normal Semmes Weinstein monofilament test. Normal tactile sensation bilaterally. Nail Exam: Pt has thick disfigured discolored nails with subungual debris noted bilateral entire nail hallux . Ulcer Exam: There is no evidence of ulcer or pre-ulcerative changes or infection.  Pincer nails  B/L Orthopedic Exam: Muscle tone and strength are WNL. No limitations in general ROM. No crepitus or effusions noted. Foot type and digits show no abnormalities. Bony prominences are unremarkable. Skin: No Porokeratosis. No infection or ulcers  Diagnosis:  Onychomycosis, , Pain in right toe, pain in left toes  Treatment & Plan Procedures and Treatment: Consent by patient was obtained for treatment procedures. The patient understood the discussion of treatment and procedures well. All questions were answered thoroughly reviewed. Debridement of mycotic and hypertrophic toenails, 1 through 5 bilateral and clearing of subungual debris. No ulceration, no infection noted.  Return Visit-Office Procedure: Patient instructed to return to the office for a follow up visit 9 weeks  for continued evaluation and treatment.    Gardiner Barefoot DPM

## 2018-10-28 ENCOUNTER — Encounter: Payer: Self-pay | Admitting: Podiatry

## 2018-10-28 ENCOUNTER — Ambulatory Visit (INDEPENDENT_AMBULATORY_CARE_PROVIDER_SITE_OTHER): Payer: Medicare Other | Admitting: Podiatry

## 2018-10-28 ENCOUNTER — Other Ambulatory Visit: Payer: Self-pay

## 2018-10-28 VITALS — Temp 97.3°F

## 2018-10-28 DIAGNOSIS — M79675 Pain in left toe(s): Secondary | ICD-10-CM | POA: Diagnosis not present

## 2018-10-28 DIAGNOSIS — B351 Tinea unguium: Secondary | ICD-10-CM

## 2018-10-28 DIAGNOSIS — M79674 Pain in right toe(s): Secondary | ICD-10-CM | POA: Diagnosis not present

## 2018-10-28 DIAGNOSIS — L608 Other nail disorders: Secondary | ICD-10-CM

## 2018-10-28 NOTE — Progress Notes (Signed)
Patient ID: Pamela Woods, female   DOB: Mar 13, 1922, 85 y.o.   MRN: [id number] Complaint:  Visit Type: Patient returns to my office for continued preventative foot care services. Complaint: Patient states" my nails have grown long and thick and become painful to walk and wear shoes" . The patient presents for preventative foot care services. No changes to ROS.    Podiatric Exam: Vascular: dorsalis pedis and posterior tibial pulses are palpable bilateral. Capillary return is immediate. Temperature gradient is WNL. Skin turgor WNL  Sensorium: Normal Semmes Weinstein monofilament test. Normal tactile sensation bilaterally. Nail Exam: Pt has thick disfigured discolored nails with subungual debris noted bilateral entire nail hallux . Ulcer Exam: There is no evidence of ulcer or pre-ulcerative changes or infection.  Pincer nails  B/L Orthopedic Exam: Muscle tone and strength are WNL. No limitations in general ROM. No crepitus or effusions noted. Foot type and digits show no abnormalities. Bony prominences are unremarkable. Skin: No Porokeratosis. No infection or ulcers  Diagnosis:  Onychomycosis, , Pain in right toe, pain in left toes  Treatment & Plan Procedures and Treatment: Consent by patient was obtained for treatment procedures. The patient understood the discussion of treatment and procedures well. All questions were answered thoroughly reviewed. Debridement of mycotic and hypertrophic toenails, 1 through 5 bilateral and clearing of subungual debris. No ulceration, no infection noted.  Return Visit-Office Procedure: Patient instructed to return to the office for a follow up visit 9 weeks  for continued evaluation and treatment.    Gardiner Barefoot DPM

## 2018-11-29 DIAGNOSIS — Z1231 Encounter for screening mammogram for malignant neoplasm of breast: Secondary | ICD-10-CM | POA: Diagnosis not present

## 2018-12-16 DIAGNOSIS — H43813 Vitreous degeneration, bilateral: Secondary | ICD-10-CM | POA: Diagnosis not present

## 2018-12-16 DIAGNOSIS — H353131 Nonexudative age-related macular degeneration, bilateral, early dry stage: Secondary | ICD-10-CM | POA: Diagnosis not present

## 2018-12-16 DIAGNOSIS — H35033 Hypertensive retinopathy, bilateral: Secondary | ICD-10-CM | POA: Diagnosis not present

## 2018-12-16 DIAGNOSIS — H04123 Dry eye syndrome of bilateral lacrimal glands: Secondary | ICD-10-CM | POA: Diagnosis not present

## 2018-12-28 DIAGNOSIS — N184 Chronic kidney disease, stage 4 (severe): Secondary | ICD-10-CM | POA: Diagnosis not present

## 2018-12-28 DIAGNOSIS — G309 Alzheimer's disease, unspecified: Secondary | ICD-10-CM | POA: Diagnosis not present

## 2018-12-28 DIAGNOSIS — E7800 Pure hypercholesterolemia, unspecified: Secondary | ICD-10-CM | POA: Diagnosis not present

## 2018-12-28 DIAGNOSIS — K219 Gastro-esophageal reflux disease without esophagitis: Secondary | ICD-10-CM | POA: Diagnosis not present

## 2018-12-28 DIAGNOSIS — I129 Hypertensive chronic kidney disease with stage 1 through stage 4 chronic kidney disease, or unspecified chronic kidney disease: Secondary | ICD-10-CM | POA: Diagnosis not present

## 2018-12-30 ENCOUNTER — Other Ambulatory Visit: Payer: Self-pay

## 2018-12-30 ENCOUNTER — Ambulatory Visit (INDEPENDENT_AMBULATORY_CARE_PROVIDER_SITE_OTHER): Payer: Medicare Other | Admitting: Podiatry

## 2018-12-30 ENCOUNTER — Encounter: Payer: Self-pay | Admitting: Podiatry

## 2018-12-30 VITALS — Temp 97.5°F

## 2018-12-30 DIAGNOSIS — B351 Tinea unguium: Secondary | ICD-10-CM | POA: Diagnosis not present

## 2018-12-30 DIAGNOSIS — L608 Other nail disorders: Secondary | ICD-10-CM

## 2018-12-30 DIAGNOSIS — M79675 Pain in left toe(s): Secondary | ICD-10-CM | POA: Diagnosis not present

## 2018-12-30 DIAGNOSIS — M79674 Pain in right toe(s): Secondary | ICD-10-CM | POA: Diagnosis not present

## 2018-12-30 NOTE — Progress Notes (Signed)
Patient ID: Pamela Woods, female   DOB: [birth year], 85 y.o.   MRN: [id number] Complaint:  Visit Type: Patient returns to my office for continued preventative foot care services. Complaint: Patient states" my nails have grown long and thick and become painful to walk and wear shoes" . The patient presents for preventative foot care services. No changes to ROS.    Podiatric Exam: Vascular: dorsalis pedis and posterior tibial pulses are palpable bilateral. Capillary return is immediate. Temperature gradient is WNL. Skin turgor WNL  Sensorium: Normal Semmes Weinstein monofilament test. Normal tactile sensation bilaterally. Nail Exam: Pt has thick disfigured discolored nails with subungual debris noted bilateral entire nail hallux . Ulcer Exam: There is no evidence of ulcer or pre-ulcerative changes or infection.  Pincer nails  B/L Orthopedic Exam: Muscle tone and strength are WNL. No limitations in general ROM. No crepitus or effusions noted. Foot type and digits show no abnormalities. Bony prominences are unremarkable. Skin: No Porokeratosis. No infection or ulcers  Diagnosis:  Onychomycosis, , Pain in right toe, pain in left toes  Treatment & Plan Procedures and Treatment: Consent by patient was obtained for treatment procedures. The patient understood the discussion of treatment and procedures well. All questions were answered thoroughly reviewed. Debridement of mycotic and hypertrophic toenails, 1 through 5 bilateral and clearing of subungual debris. No ulceration, no infection noted.  Return Visit-Office Procedure: Patient instructed to return to the office for a follow up visit 9 weeks  for continued evaluation and treatment.    Gardiner Barefoot DPM

## 2019-03-03 ENCOUNTER — Encounter: Payer: Self-pay | Admitting: Podiatry

## 2019-03-03 ENCOUNTER — Other Ambulatory Visit: Payer: Self-pay

## 2019-03-03 ENCOUNTER — Ambulatory Visit (INDEPENDENT_AMBULATORY_CARE_PROVIDER_SITE_OTHER): Payer: Medicare Other | Admitting: Podiatry

## 2019-03-03 VITALS — Temp 97.5°F

## 2019-03-03 DIAGNOSIS — M79674 Pain in right toe(s): Secondary | ICD-10-CM

## 2019-03-03 DIAGNOSIS — M79675 Pain in left toe(s): Secondary | ICD-10-CM | POA: Diagnosis not present

## 2019-03-03 DIAGNOSIS — B351 Tinea unguium: Secondary | ICD-10-CM

## 2019-03-03 DIAGNOSIS — L608 Other nail disorders: Secondary | ICD-10-CM

## 2019-03-03 NOTE — Progress Notes (Signed)
Patient ID: SARAGRACE SELKE, female   DOB: 10-07-1921, 85 y.o.   MRN: [id number] Complaint:  Visit Type: Patient returns to my office for continued preventative foot care services. Complaint: Patient states" my nails have grown long and thick and become painful to walk and wear shoes" . The patient presents for preventative foot care services. No changes to ROS.    Podiatric Exam: Vascular: dorsalis pedis and posterior tibial pulses are palpable bilateral. Capillary return is immediate. Temperature gradient is WNL. Skin turgor WNL  Sensorium: Normal Semmes Weinstein monofilament test. Normal tactile sensation bilaterally. Nail Exam: Pt has thick disfigured discolored nails with subungual debris noted bilateral entire nail hallux . Ulcer Exam: There is no evidence of ulcer or pre-ulcerative changes or infection.  Pincer nails  B/L Orthopedic Exam: Muscle tone and strength are WNL. No limitations in general ROM. No crepitus or effusions noted. Foot type and digits show no abnormalities. Bony prominences are unremarkable. Skin: No Porokeratosis. No infection or ulcers  Diagnosis:  Onychomycosis, , Pain in right toe, pain in left toes  Treatment & Plan Procedures and Treatment: Consent by patient was obtained for treatment procedures. The patient understood the discussion of treatment and procedures well. All questions were answered thoroughly reviewed. Debridement of mycotic and hypertrophic toenails, 1 through 5 bilateral and clearing of subungual debris. No ulceration, no infection noted.  Return Visit-Office Procedure: Patient instructed to return to the office for a follow up visit 9 weeks  for continued evaluation and treatment.    Gardiner Barefoot DPM

## 2019-03-21 DIAGNOSIS — H35033 Hypertensive retinopathy, bilateral: Secondary | ICD-10-CM | POA: Diagnosis not present

## 2019-03-21 DIAGNOSIS — H353131 Nonexudative age-related macular degeneration, bilateral, early dry stage: Secondary | ICD-10-CM | POA: Diagnosis not present

## 2019-03-21 DIAGNOSIS — H43813 Vitreous degeneration, bilateral: Secondary | ICD-10-CM | POA: Diagnosis not present

## 2019-03-21 DIAGNOSIS — H04123 Dry eye syndrome of bilateral lacrimal glands: Secondary | ICD-10-CM | POA: Diagnosis not present

## 2019-04-03 DIAGNOSIS — Z23 Encounter for immunization: Secondary | ICD-10-CM | POA: Diagnosis not present

## 2019-04-19 ENCOUNTER — Ambulatory Visit (INDEPENDENT_AMBULATORY_CARE_PROVIDER_SITE_OTHER): Payer: Medicare Other | Admitting: Podiatry

## 2019-04-19 ENCOUNTER — Encounter: Payer: Self-pay | Admitting: Podiatry

## 2019-04-19 ENCOUNTER — Other Ambulatory Visit: Payer: Self-pay

## 2019-04-19 DIAGNOSIS — M79675 Pain in left toe(s): Secondary | ICD-10-CM | POA: Diagnosis not present

## 2019-04-19 DIAGNOSIS — L608 Other nail disorders: Secondary | ICD-10-CM

## 2019-04-19 DIAGNOSIS — B351 Tinea unguium: Secondary | ICD-10-CM | POA: Diagnosis not present

## 2019-04-19 DIAGNOSIS — M79674 Pain in right toe(s): Secondary | ICD-10-CM

## 2019-04-19 NOTE — Progress Notes (Signed)
Patient ID: Pamela Woods, female   DOB: 08/12/1921, 85 y.o.   MRN: [id number] Complaint:  Visit Type: Patient returns to my office for continued preventative foot care services. Complaint: Patient states" my nails have grown long and thick and become painful to walk and wear shoes" . The patient presents for preventative foot care services. No changes to ROS.    Podiatric Exam: Vascular: dorsalis pedis and posterior tibial pulses are palpable bilateral. Capillary return is immediate. Temperature gradient is WNL. Skin turgor WNL  Sensorium: Normal Semmes Weinstein monofilament test. Normal tactile sensation bilaterally. Nail Exam: Pt has thick disfigured discolored nails with subungual debris noted bilateral entire nail hallux . Ulcer Exam: There is no evidence of ulcer or pre-ulcerative changes or infection.  Pincer nails  B/L Orthopedic Exam: Muscle tone and strength are WNL. No limitations in general ROM. No crepitus or effusions noted. Foot type and digits show no abnormalities. Bony prominences are unremarkable. Skin: No Porokeratosis. No infection or ulcers  Diagnosis:  Onychomycosis, , Pain in right toe, pain in left toes  Treatment & Plan Procedures and Treatment: Consent by patient was obtained for treatment procedures. The patient understood the discussion of treatment and procedures well. All questions were answered thoroughly reviewed. Debridement of mycotic and hypertrophic toenails, 1 through 5 bilateral and clearing of subungual debris. No ulceration, no infection noted.  Return Visit-Office Procedure: Patient instructed to return to the office for a follow up visit 9 weeks  for continued evaluation and treatment.    Bayard More DPM 

## 2019-05-10 ENCOUNTER — Ambulatory Visit: Payer: Medicare Other | Admitting: Podiatry

## 2019-06-27 ENCOUNTER — Ambulatory Visit (INDEPENDENT_AMBULATORY_CARE_PROVIDER_SITE_OTHER): Payer: Medicare Other | Admitting: Podiatry

## 2019-06-27 ENCOUNTER — Encounter: Payer: Self-pay | Admitting: Podiatry

## 2019-06-27 ENCOUNTER — Other Ambulatory Visit: Payer: Self-pay

## 2019-06-27 DIAGNOSIS — M79674 Pain in right toe(s): Secondary | ICD-10-CM

## 2019-06-27 DIAGNOSIS — L608 Other nail disorders: Secondary | ICD-10-CM | POA: Diagnosis not present

## 2019-06-27 DIAGNOSIS — M79675 Pain in left toe(s): Secondary | ICD-10-CM | POA: Diagnosis not present

## 2019-06-27 DIAGNOSIS — B351 Tinea unguium: Secondary | ICD-10-CM

## 2019-06-27 NOTE — Progress Notes (Signed)
Patient ID: Pamela Woods, female   DOB: 12/09/1921, 85 y.o.   MRN: [id number] Complaint:  Visit Type: Patient returns to my office for continued preventative foot care services. Complaint: Patient states" my nails have grown long and thick and become painful to walk and wear shoes" . The patient presents for preventative foot care services. No changes to ROS.    Podiatric Exam: Vascular: dorsalis pedis and posterior tibial pulses are palpable bilateral. Capillary return is immediate. Temperature gradient is WNL. Skin turgor WNL  Sensorium: Normal Semmes Weinstein monofilament test. Normal tactile sensation bilaterally. Nail Exam: Pt has thick disfigured discolored nails with subungual debris noted bilateral entire nail hallux . Ulcer Exam: There is no evidence of ulcer or pre-ulcerative changes or infection.  Pincer nails  B/L Orthopedic Exam: Muscle tone and strength are WNL. No limitations in general ROM. No crepitus or effusions noted. Foot type and digits show no abnormalities. Bony prominences are unremarkable. Skin: No Porokeratosis. No infection or ulcers  Diagnosis:  Onychomycosis, , Pain in right toe, pain in left toes  Treatment & Plan Procedures and Treatment: Consent by patient was obtained for treatment procedures. The patient understood the discussion of treatment and procedures well. All questions were answered thoroughly reviewed. Debridement of mycotic and hypertrophic toenails, 1 through 5 bilateral and clearing of subungual debris. No ulceration, no infection noted.  Return Visit-Office Procedure: Patient instructed to return to the office for a follow up visit 9 weeks  for continued evaluation and treatment.    Yani Lal DPM 

## 2019-08-02 DIAGNOSIS — F322 Major depressive disorder, single episode, severe without psychotic features: Secondary | ICD-10-CM | POA: Diagnosis not present

## 2019-08-02 DIAGNOSIS — N184 Chronic kidney disease, stage 4 (severe): Secondary | ICD-10-CM | POA: Diagnosis not present

## 2019-08-02 DIAGNOSIS — R54 Age-related physical debility: Secondary | ICD-10-CM | POA: Diagnosis not present

## 2019-08-02 DIAGNOSIS — Z0000 Encounter for general adult medical examination without abnormal findings: Secondary | ICD-10-CM | POA: Diagnosis not present

## 2019-08-02 DIAGNOSIS — K219 Gastro-esophageal reflux disease without esophagitis: Secondary | ICD-10-CM | POA: Diagnosis not present

## 2019-08-02 DIAGNOSIS — G309 Alzheimer's disease, unspecified: Secondary | ICD-10-CM | POA: Diagnosis not present

## 2019-08-02 DIAGNOSIS — Z23 Encounter for immunization: Secondary | ICD-10-CM | POA: Diagnosis not present

## 2019-08-02 DIAGNOSIS — E7800 Pure hypercholesterolemia, unspecified: Secondary | ICD-10-CM | POA: Diagnosis not present

## 2019-08-02 DIAGNOSIS — I129 Hypertensive chronic kidney disease with stage 1 through stage 4 chronic kidney disease, or unspecified chronic kidney disease: Secondary | ICD-10-CM | POA: Diagnosis not present

## 2019-08-29 ENCOUNTER — Ambulatory Visit (INDEPENDENT_AMBULATORY_CARE_PROVIDER_SITE_OTHER): Payer: Medicare Other | Admitting: Podiatry

## 2019-08-29 ENCOUNTER — Other Ambulatory Visit: Payer: Self-pay

## 2019-08-29 ENCOUNTER — Encounter: Payer: Self-pay | Admitting: Podiatry

## 2019-08-29 DIAGNOSIS — M79675 Pain in left toe(s): Secondary | ICD-10-CM | POA: Diagnosis not present

## 2019-08-29 DIAGNOSIS — L608 Other nail disorders: Secondary | ICD-10-CM

## 2019-08-29 DIAGNOSIS — B351 Tinea unguium: Secondary | ICD-10-CM | POA: Diagnosis not present

## 2019-08-29 DIAGNOSIS — M79674 Pain in right toe(s): Secondary | ICD-10-CM | POA: Diagnosis not present

## 2019-08-29 NOTE — Progress Notes (Signed)
Patient ID: [name], female   DOB: 05-14-1922, 86 y.o.   MRN: [id number] Complaint:  Visit Type: Patient returns to my office for continued preventative foot care services. Complaint: Patient states" my nails have grown long and thick and become painful to walk and wear shoes" . The patient presents for preventative foot care services. No changes to ROS.    Podiatric Exam: Vascular: dorsalis pedis and posterior tibial pulses are palpable bilateral. Capillary return is immediate. Temperature gradient is WNL. Skin turgor WNL  Sensorium: Normal Semmes Weinstein monofilament test. Normal tactile sensation bilaterally. Nail Exam: Pt has thick disfigured discolored nails with subungual debris noted bilateral entire nail hallux . Ulcer Exam: There is no evidence of ulcer or pre-ulcerative changes or infection.  Pincer nails  B/L Orthopedic Exam: Muscle tone and strength are WNL. No limitations in general ROM. No crepitus or effusions noted. Foot type and digits show no abnormalities. Bony prominences are unremarkable. Skin: No Porokeratosis. No infection or ulcers  Diagnosis:  Onychomycosis, , Pain in right toe, pain in left toes  Treatment & Plan Procedures and Treatment: Consent by patient was obtained for treatment procedures. The patient understood the discussion of treatment and procedures well. All questions were answered thoroughly reviewed. Debridement of mycotic and hypertrophic toenails, 1 through 5 bilateral and clearing of subungual debris. No ulceration, no infection noted.  Return Visit-Office Procedure: Patient instructed to return to the office for a follow up visit 9 weeks  for continued evaluation and treatment.    Gardiner Barefoot DPM

## 2019-09-10 ENCOUNTER — Ambulatory Visit: Payer: Medicare Other | Attending: Internal Medicine

## 2019-09-10 DIAGNOSIS — Z23 Encounter for immunization: Secondary | ICD-10-CM

## 2019-09-10 NOTE — Progress Notes (Signed)
   Covid-19 Vaccination Clinic  Name:  PHEOBE SANDIFORD    MRN: [id number] DOB: 1922/05/14  09/10/2019  Ms. Wahba was observed post Covid-19 immunization for 15 minutes without incidence. She was provided with Vaccine Information Sheet and instruction to access the V-Safe system.   Ms. Fargnoli was instructed to call 911 with any severe reactions post vaccine: Marland Kitchen Difficulty breathing  . Swelling of your face and throat  . A fast heartbeat  . A bad rash all over your body  . Dizziness and weakness    Immunizations Administered    Name Date Dose VIS Date Route   Pfizer COVID-19 Vaccine 09/10/2019  1:43 PM 0.3 mL 07/07/2019 Intramuscular   Manufacturer: [place]   Lot: [id number]   [place]: [address]-9

## 2019-09-20 DIAGNOSIS — F322 Major depressive disorder, single episode, severe without psychotic features: Secondary | ICD-10-CM | POA: Diagnosis not present

## 2019-10-03 ENCOUNTER — Ambulatory Visit: Payer: Medicare Other | Attending: Internal Medicine

## 2019-10-03 DIAGNOSIS — H04123 Dry eye syndrome of bilateral lacrimal glands: Secondary | ICD-10-CM | POA: Diagnosis not present

## 2019-10-03 DIAGNOSIS — H353131 Nonexudative age-related macular degeneration, bilateral, early dry stage: Secondary | ICD-10-CM | POA: Diagnosis not present

## 2019-10-03 DIAGNOSIS — Z23 Encounter for immunization: Secondary | ICD-10-CM

## 2019-10-03 DIAGNOSIS — H35033 Hypertensive retinopathy, bilateral: Secondary | ICD-10-CM | POA: Diagnosis not present

## 2019-10-03 DIAGNOSIS — H43813 Vitreous degeneration, bilateral: Secondary | ICD-10-CM | POA: Diagnosis not present

## 2019-10-03 NOTE — Progress Notes (Signed)
   Covid-19 Vaccination Clinic  Name:  Pamela Woods    MRN: [id number] DOB: 06-29-1922  10/03/2019  [name] was observed post Covid-19 immunization for 15 minutes without incident. She was provided with Vaccine Information Sheet and instruction to access the V-Safe system.   [name] was instructed to call 911 with any severe reactions post vaccine: Marland Kitchen Difficulty breathing  . Swelling of face and throat  . A fast heartbeat  . A bad rash all over body  . Dizziness and weakness   Immunizations Administered    Name Date Dose VIS Date Route   Pfizer COVID-19 Vaccine 10/03/2019  2:47 PM 0.3 mL 07/07/2019 Intramuscular   Manufacturer: [place]   Lot: [id number]   [place]: [address]-1

## 2019-10-04 ENCOUNTER — Ambulatory Visit: Payer: Medicare Other

## 2019-10-31 ENCOUNTER — Other Ambulatory Visit: Payer: Self-pay

## 2019-10-31 ENCOUNTER — Encounter: Payer: Self-pay | Admitting: Podiatry

## 2019-10-31 ENCOUNTER — Ambulatory Visit (INDEPENDENT_AMBULATORY_CARE_PROVIDER_SITE_OTHER): Payer: Medicare Other | Admitting: Podiatry

## 2019-10-31 VITALS — Temp 97.7°F

## 2019-10-31 DIAGNOSIS — L608 Other nail disorders: Secondary | ICD-10-CM

## 2019-10-31 DIAGNOSIS — M79675 Pain in left toe(s): Secondary | ICD-10-CM

## 2019-10-31 DIAGNOSIS — M79674 Pain in right toe(s): Secondary | ICD-10-CM

## 2019-10-31 DIAGNOSIS — B351 Tinea unguium: Secondary | ICD-10-CM | POA: Diagnosis not present

## 2019-10-31 NOTE — Progress Notes (Signed)
This patient returns to the office for evaluation and treatment of long thick painful nails .  This patient is unable to trim his own nails since the patient cannot reach the feet.  Patient says the nails are painful walking and wearing her shoes. She  returns for preventive foot care services.  She presents to the office with her daughters.  General Appearance  Alert, conversant and in no acute stress.  Vascular  Dorsalis pedis and posterior tibial  pulses are palpable  bilaterally.  Capillary return is within normal limits  bilaterally. Temperature is within normal limits  bilaterally.  Neurologic  Senn-Weinstein monofilament wire test within normal limits  bilaterally. Muscle power within normal limits bilaterally.  Nails Thick disfigured discolored nails with subungual debris  from hallux to fifth toes bilaterally.  Pincer nails hallux  B/L. No evidence of bacterial infection or drainage bilaterally.  Orthopedic  No limitations of motion  feet .  No crepitus or effusions noted.  No bony pathology or digital deformities noted.  Skin  normotropic skin with no porokeratosis noted bilaterally.  No signs of infections or ulcers noted.     Onychomycosis  Pain in toes right foot  Pain in toes left foot  Debridement  of nails  1-5  B/L with a nail nipper.  Nails were then filed using a dremel tool with no incidents.    RTC  9 weeks.   Gardiner Barefoot DPM

## 2019-11-17 ENCOUNTER — Other Ambulatory Visit: Payer: Self-pay

## 2019-11-17 ENCOUNTER — Emergency Department (HOSPITAL_BASED_OUTPATIENT_CLINIC_OR_DEPARTMENT_OTHER): Payer: Medicare Other

## 2019-11-17 ENCOUNTER — Emergency Department (HOSPITAL_BASED_OUTPATIENT_CLINIC_OR_DEPARTMENT_OTHER)
Admission: EM | Admit: 2019-11-17 | Discharge: 2019-11-17 | Disposition: A | Discharge status: Home / Self Care | Payer: Medicare Other | Attending: Emergency Medicine | Admitting: Emergency Medicine

## 2019-11-17 ENCOUNTER — Encounter (HOSPITAL_BASED_OUTPATIENT_CLINIC_OR_DEPARTMENT_OTHER): Payer: Self-pay

## 2019-11-17 DIAGNOSIS — F0390 Unspecified dementia without behavioral disturbance: Secondary | ICD-10-CM | POA: Diagnosis not present

## 2019-11-17 DIAGNOSIS — Z79899 Other long term (current) drug therapy: Secondary | ICD-10-CM | POA: Diagnosis not present

## 2019-11-17 DIAGNOSIS — Y9389 Activity, other specified: Secondary | ICD-10-CM | POA: Diagnosis not present

## 2019-11-17 DIAGNOSIS — Y92018 Other place in single-family (private) house as the place of occurrence of the external cause: Secondary | ICD-10-CM | POA: Insufficient documentation

## 2019-11-17 DIAGNOSIS — S51012A Laceration without foreign body of left elbow, initial encounter: Secondary | ICD-10-CM | POA: Diagnosis not present

## 2019-11-17 DIAGNOSIS — I10 Essential (primary) hypertension: Secondary | ICD-10-CM | POA: Diagnosis not present

## 2019-11-17 DIAGNOSIS — Y998 Other external cause status: Secondary | ICD-10-CM | POA: Insufficient documentation

## 2019-11-17 DIAGNOSIS — W07XXXA Fall from chair, initial encounter: Secondary | ICD-10-CM | POA: Diagnosis not present

## 2019-11-17 DIAGNOSIS — Z8551 Personal history of malignant neoplasm of bladder: Secondary | ICD-10-CM | POA: Diagnosis not present

## 2019-11-17 DIAGNOSIS — S59902A Unspecified injury of left elbow, initial encounter: Secondary | ICD-10-CM | POA: Diagnosis not present

## 2019-11-17 NOTE — Discharge Instructions (Signed)
X-ray is normal without broken bones.  You can put a dressing on the skin tear and should heal on its own.

## 2019-11-17 NOTE — ED Triage Notes (Addendum)
Per daughter pt with multiple falls in the last month-fell today ~5pm-c/o pain to left elbow with skin tear noted-NAD-to triage in w/c

## 2019-11-17 NOTE — ED Provider Notes (Signed)
[facility] Provider Note   CSN: [id number] Arrival date & time: 11/17/19  2006     History Chief Complaint  Patient presents with  . Fall    Pamela Woods is a 86 y.o. female.  Patient is a 86 year old female with a history of dementia, hypertension, hyperlipidemia and prior bladder cancer who presents today after a fall while at home.  Patient's daughter gives the history as she is her primary caregiver.  Daughter states over the last several months patient has been more apt to fall.  It is usually when she sits down she does not sit evenly on the chair and then falls off the side or if she bends over she has a hard time standing back up without losing her balance.  Tonight she was sitting and dropped something and she leaned over to get what she had dropped onto the floor and she fell onto her left elbow.  Daughter denies any head injury or loss of consciousness.  Daughter reports that patient has been at her mental status baseline and has not had significant changes.  She has not had any recent medication changes.  However after the fall she has been complaining of pain in her left elbow.  She was able to ambulate after the fall and did not appear to have pain in her lower extremities.  She does not take anticoagulation.  She has not had any recent infection.  The history is provided by a caregiver.  Fall       Past Medical History:  Diagnosis Date  . Bladder cancer ([place])   . Dementia ([place])   . Hyperlipidemia   . Hypertension     Patient Active Problem List   Diagnosis Date Noted  . Bilateral hearing loss 04/26/2018  . Bilateral impacted cerumen 04/26/2018    Past Surgical History:  Procedure Laterality Date  . CHOLECYSTECTOMY    . RADICAL HYSTERECTOMY WITH TRANSPOSITION OF OVARIES Bilateral    removal of ovaries     OB History   No obstetric history on file.     No family history on file.  Social History   Tobacco Use    . Smoking status: Never Smoker  . Smokeless tobacco: Never Used  Substance Use Topics  . Alcohol use: Never  . Drug use: Never    Home Medications Prior to Admission medications   Medication Sig Start Date End Date Taking? Authorizing Provider  atorvastatin (LIPITOR) 20 MG tablet Take 20 mg by mouth daily. 03/06/18   [provider]  cholecalciferol (VITAMIN D) 1000 UNITS tablet Take 1,000 Units by mouth daily.    [provider]  Cyanocobalamin (VITAMIN B-12 PO) Take by mouth.    [provider]  famotidine (PEPCID) 40 MG tablet TAKE 1 TABLET BY MOUTH EVERYDAY AT BEDTIME 06/28/18   [provider]  furosemide (LASIX) 20 MG tablet TAKE 1/2 TABLET ONCE A DAY 02/08/18   [provider]  IRON CR PO Take by mouth.    [provider]  Multiple Vitamins-Minerals (PRESERVISION AREDS PO) Take by mouth.    [provider]  olmesartan (BENICAR) 20 MG tablet Take 20 mg by mouth daily. 01/08/18   [provider]  pantoprazole (PROTONIX) 40 MG tablet Take 40 mg by mouth daily. 04/07/18   [provider]  ranitidine (ZANTAC) 150 MG tablet Take 150 mg by mouth daily.     [provider]  sertraline (ZOLOFT) 25 MG tablet Take  25 mg by mouth daily. 08/02/19   [provider]  vitamin C (ASCORBIC ACID) 500 MG tablet Take 500 mg by mouth daily.    [provider]    Allergies    Ampicillin, Cephalosporins, Doxycycline, Nabumetone, Nitrofurantoin, Other, and Sulfur  Review of Systems   Review of Systems  All other systems reviewed and are negative.   Physical Exam Updated Vital Signs BP (!) 207/83 (BP Location: Right Arm)   Pulse 97   Temp 97.9 F (36.6 C) (Oral)   Resp 18   Ht 5\' 1"  (1.549 m)   Wt 42.6 kg   SpO2 98%   BMI 17.76 kg/m   Physical Exam Vitals and nursing note reviewed.  Constitutional:      General: She is not in acute distress.    Appearance: Normal appearance. She is  well-developed and normal weight.  HENT:     Head: Normocephalic and atraumatic.  Eyes:     Pupils: Pupils are equal, round, and reactive to light.  Cardiovascular:     Rate and Rhythm: Normal rate and regular rhythm.     Heart sounds: Normal heart sounds. No murmur. No friction rub.  Pulmonary:     Effort: Pulmonary effort is normal.     Breath sounds: Normal breath sounds. No wheezing or rales.  Abdominal:     General: Bowel sounds are normal. There is no distension.     Palpations: Abdomen is soft.     Tenderness: There is no abdominal tenderness. There is no guarding or rebound.  Musculoskeletal:        General: Tenderness present.     Left elbow: Normal range of motion. Tenderness present in olecranon process.       Arms:     Right lower leg: No edema.     Left lower leg: No edema.     Comments: No edema  Skin:    General: Skin is warm and dry.     Findings: No rash.  Neurological:     General: No focal deficit present.     Mental Status: She is alert. Mental status is at baseline.     Cranial Nerves: No cranial nerve deficit.  Psychiatric:        Behavior: Behavior normal.      ED Results / Procedures / Treatments   Labs (all labs ordered are listed, but only abnormal results are displayed) Labs Reviewed - No data to display  EKG None  Radiology DG Elbow Complete Left  Result Date: 11/17/2019 CLINICAL DATA:  Left elbow pain after injury. Multiple recent falls, most recently landing on left elbow. EXAM: LEFT ELBOW - COMPLETE 3+ VIEW COMPARISON:  None. FINDINGS: There is no evidence of fracture, dislocation, or joint effusion. Small olecranon spur. Soft tissues are unremarkable. IMPRESSION: No fracture or subluxation of the left elbow. Electronically Signed   By: Keith Rake M.D.   On: 11/17/2019 21:40    Procedures Procedures (including critical care time)  Medications Ordered in ED Medications - No data to display  ED Course  I have reviewed the  triage vital signs and the nursing notes.  Pertinent labs & imaging results that were available during my care of the patient were reviewed by me and considered in my medical decision making (see chart for details).    MDM Rules/Calculators/A&P                      Patient is an elderly female  presenting today after falling at home when she bent over and tried to pick something up.  She has a small skin tear to the left elbow but no significant other evidence of injury.  Patient was able to ambulate after this and daughter reports no evidence of pain in her legs.  Patient has had gradually worsening balance but no abrupt changes.  She has had no head injury or loss of consciousness.  Patient is hypertensive today at 207/83 but does take blood pressure medication.  Recommended patient continue her blood pressure medicine and her of her blood pressure remains elevated she will need to follow-up with her doctor for possible blood pressure medication adjustment.  Patient is well-appearing otherwise.  X-ray of the elbow is without acute findings.  Bandage applied to wound and patient was discharged home with her daughter in good condition.  MDM Number of Diagnoses or Management Options Skin tear of left elbow without complication, initial encounter: minor   Amount and/or Complexity of Data Reviewed Tests in the radiology section of CPT: ordered and reviewed Obtain history from someone other than the patient: yes Discuss the patient with other providers: no Independent visualization of images, tracings, or specimens: yes  Risk of Complications, Morbidity, and/or Mortality Presenting problems: moderate Diagnostic procedures: minimal Management options: minimal  Patient Progress Patient progress: stable  Final Clinical Impression(s) / ED Diagnoses Final diagnoses:  Skin tear of left elbow without complication, initial encounter    Rx / DC Orders ED Discharge Orders    None         Blanchie Dessert, MD 11/17/19 2322

## 2019-11-21 DIAGNOSIS — R54 Age-related physical debility: Secondary | ICD-10-CM | POA: Diagnosis not present

## 2019-11-21 DIAGNOSIS — R634 Abnormal weight loss: Secondary | ICD-10-CM | POA: Diagnosis not present

## 2019-11-21 DIAGNOSIS — F322 Major depressive disorder, single episode, severe without psychotic features: Secondary | ICD-10-CM | POA: Diagnosis not present

## 2019-12-07 DIAGNOSIS — G309 Alzheimer's disease, unspecified: Secondary | ICD-10-CM | POA: Diagnosis not present

## 2019-12-07 DIAGNOSIS — R54 Age-related physical debility: Secondary | ICD-10-CM | POA: Diagnosis not present

## 2019-12-09 ENCOUNTER — Emergency Department (HOSPITAL_BASED_OUTPATIENT_CLINIC_OR_DEPARTMENT_OTHER)
Admission: EM | Admit: 2019-12-09 | Discharge: 2019-12-09 | Disposition: A | Discharge status: Home / Self Care | Payer: Medicare Other | Attending: Emergency Medicine | Admitting: Emergency Medicine

## 2019-12-09 ENCOUNTER — Encounter (HOSPITAL_BASED_OUTPATIENT_CLINIC_OR_DEPARTMENT_OTHER): Payer: Self-pay | Admitting: Emergency Medicine

## 2019-12-09 ENCOUNTER — Emergency Department (HOSPITAL_BASED_OUTPATIENT_CLINIC_OR_DEPARTMENT_OTHER): Payer: Medicare Other

## 2019-12-09 ENCOUNTER — Other Ambulatory Visit: Payer: Self-pay

## 2019-12-09 DIAGNOSIS — R109 Unspecified abdominal pain: Secondary | ICD-10-CM | POA: Diagnosis present

## 2019-12-09 DIAGNOSIS — Z79899 Other long term (current) drug therapy: Secondary | ICD-10-CM | POA: Insufficient documentation

## 2019-12-09 DIAGNOSIS — N289 Disorder of kidney and ureter, unspecified: Secondary | ICD-10-CM | POA: Diagnosis not present

## 2019-12-09 DIAGNOSIS — R1031 Right lower quadrant pain: Secondary | ICD-10-CM | POA: Diagnosis not present

## 2019-12-09 DIAGNOSIS — I10 Essential (primary) hypertension: Secondary | ICD-10-CM | POA: Insufficient documentation

## 2019-12-09 DIAGNOSIS — F0390 Unspecified dementia without behavioral disturbance: Secondary | ICD-10-CM | POA: Diagnosis not present

## 2019-12-09 LAB — CBC WITH DIFFERENTIAL/PLATELET
Abs Immature Granulocytes: 0.02 10*3/uL (ref 0.00–0.07)
Basophils Absolute: 0 10*3/uL (ref 0.0–0.1)
Basophils Relative: 0 %
Eosinophils Absolute: 0 10*3/uL (ref 0.0–0.5)
Eosinophils Relative: 0 %
HCT: 34.6 % — ABNORMAL LOW (ref 36.0–46.0)
Hemoglobin: 11.2 g/dL — ABNORMAL LOW (ref 12.0–15.0)
Immature Granulocytes: 0 %
Lymphocytes Relative: 16 %
Lymphs Abs: 1.1 10*3/uL (ref 0.7–4.0)
MCH: 30.1 pg (ref 26.0–34.0)
MCHC: 32.4 g/dL (ref 30.0–36.0)
MCV: 93 fL (ref 80.0–100.0)
Monocytes Absolute: 0.5 10*3/uL (ref 0.1–1.0)
Monocytes Relative: 8 %
Neutro Abs: 5.1 10*3/uL (ref 1.7–7.7)
Neutrophils Relative %: 76 %
Platelets: 139 10*3/uL — ABNORMAL LOW (ref 150–400)
RBC: 3.72 MIL/uL — ABNORMAL LOW (ref 3.87–5.11)
RDW: 12.6 % (ref 11.5–15.5)
WBC: 6.7 10*3/uL (ref 4.0–10.5)
nRBC: 0 % (ref 0.0–0.2)

## 2019-12-09 LAB — URINALYSIS, ROUTINE W REFLEX MICROSCOPIC
Bilirubin Urine: NEGATIVE
Glucose, UA: 100 mg/dL — AB
Hgb urine dipstick: NEGATIVE
Ketones, ur: NEGATIVE mg/dL
Leukocytes,Ua: NEGATIVE
Nitrite: NEGATIVE
Protein, ur: NEGATIVE mg/dL
Specific Gravity, Urine: 1.015 (ref 1.005–1.030)
pH: 6.5 (ref 5.0–8.0)

## 2019-12-09 LAB — COMPREHENSIVE METABOLIC PANEL
ALT: 13 U/L (ref 0–44)
AST: 16 U/L (ref 15–41)
Albumin: 3.7 g/dL (ref 3.5–5.0)
Alkaline Phosphatase: 61 U/L (ref 38–126)
Anion gap: 10 (ref 5–15)
BUN: 31 mg/dL — ABNORMAL HIGH (ref 8–23)
CO2: 29 mmol/L (ref 22–32)
Calcium: 9.1 mg/dL (ref 8.9–10.3)
Chloride: 101 mmol/L (ref 98–111)
Creatinine, Ser: 1.55 mg/dL — ABNORMAL HIGH (ref 0.44–1.00)
GFR calc Af Amer: 32 mL/min — ABNORMAL LOW (ref >60)
GFR calc non Af Amer: 28 mL/min — ABNORMAL LOW (ref >60)
Glucose, Bld: 133 mg/dL — ABNORMAL HIGH (ref 70–99)
Potassium: 4.2 mmol/L (ref 3.5–5.1)
Sodium: 140 mmol/L (ref 135–145)
Total Bilirubin: 1 mg/dL (ref 0.3–1.2)
Total Protein: 6.9 g/dL (ref 6.5–8.1)

## 2019-12-09 LAB — LIPASE, BLOOD: Lipase: 28 U/L (ref 11–51)

## 2019-12-09 NOTE — Discharge Instructions (Addendum)
Pamela Woods had a CT scan performed in the Emergency Department today that showed a cyst on her left kidney that will need to be followed up with her family doctor.  She also had some air in her lower pelvis of unclear significance.  Please have her rechecked if she develops new lower abdominal pain, fevers, vaginal discharge, or new concerning symptoms.  She can take tylenol, according to label instructions if she has mild pain.

## 2019-12-09 NOTE — ED Provider Notes (Signed)
[facility] EMERGENCY DEPARTMENT Provider Note   CSN: [id number] Arrival date & time: 12/09/19  1721     History Chief Complaint  Patient presents with  . Abdominal Pain    FRANCHESCA VENEZIANO is a 86 y.o. female.  The history is provided by the patient, a relative and medical records. No language interpreter was used.  Abdominal Pain  CHANTELLA CREECH is a 86 y.o. female who presents to the Emergency Department complaining of abdominal pain.  Level V caveat due to dementia.  Hx is provided by patient's daughter.  She presents to the ED complaining of three hours of RLQ abdominal pain.  She has associated nausea, no vomiting.  She has some constipation.  Last BM yesterday.  No dysuria, no vaginal discharge. No known sick contacts.  Lives with daughter.  Has a hx/o cholecystectomy, chemo for bladder cancer.  Sxs are moderate, constant in nature.       Past Medical History:  Diagnosis Date  . Bladder cancer ([place])   . Dementia ([place])   . Hyperlipidemia   . Hypertension     Patient Active Problem List   Diagnosis Date Noted  . Bilateral hearing loss 04/26/2018  . Bilateral impacted cerumen 04/26/2018    Past Surgical History:  Procedure Laterality Date  . CHOLECYSTECTOMY    . RADICAL HYSTERECTOMY WITH TRANSPOSITION OF OVARIES Bilateral    removal of ovaries     OB History   No obstetric history on file.     History reviewed. No pertinent family history.  Social History   Tobacco Use  . Smoking status: Never Smoker  . Smokeless tobacco: Never Used  Substance Use Topics  . Alcohol use: Never  . Drug use: Never    Home Medications Prior to Admission medications   Medication Sig Start Date End Date Taking? Authorizing Provider  atorvastatin (LIPITOR) 20 MG tablet Take 20 mg by mouth daily. 03/06/18   [provider]  cholecalciferol (VITAMIN D) 1000 UNITS tablet Take 1,000 Units by mouth daily.    [provider]  Cyanocobalamin  (VITAMIN B-12 PO) Take by mouth.    [provider]  famotidine (PEPCID) 40 MG tablet TAKE 1 TABLET BY MOUTH EVERYDAY AT BEDTIME 06/28/18   [provider]  furosemide (LASIX) 20 MG tablet TAKE 1/2 TABLET ONCE A DAY 02/08/18   [provider]  IRON CR PO Take by mouth.    [provider]  Multiple Vitamins-Minerals (PRESERVISION AREDS PO) Take by mouth.    [provider]  olmesartan (BENICAR) 20 MG tablet Take 20 mg by mouth daily. 01/08/18   [provider]  pantoprazole (PROTONIX) 40 MG tablet Take 40 mg by mouth daily. 04/07/18   [provider]  ranitidine (ZANTAC) 150 MG tablet Take 150 mg by mouth daily.     [provider]  sertraline (ZOLOFT) 25 MG tablet Take 25 mg by mouth daily. 08/02/19   [provider]  vitamin C (ASCORBIC ACID) 500 MG tablet Take 500 mg by mouth daily.    [provider]    Allergies    Ampicillin, Cephalosporins, Doxycycline, Nabumetone, Nitrofurantoin, Other, and Sulfur  Review of Systems   Review of Systems  Gastrointestinal: Positive for abdominal pain.  All other systems reviewed and are negative.   Physical Exam Updated Vital Signs BP (!) 179/100   Pulse (!) 51   Temp 98.1 F (36.7 C) (Oral)   Resp 18   Wt 42.6 kg  SpO2 99%   BMI 17.75 kg/m   Physical Exam Vitals and nursing note reviewed.  Constitutional:      Appearance: She is well-developed.  HENT:     Head: Normocephalic and atraumatic.  Cardiovascular:     Rate and Rhythm: Normal rate and regular rhythm.     Heart sounds: No murmur.  Pulmonary:     Effort: Pulmonary effort is normal. No respiratory distress.     Breath sounds: Normal breath sounds.  Abdominal:     Palpations: Abdomen is soft.     Tenderness: There is no guarding or rebound.     Comments: Mild generalized abdominal tenderness  Musculoskeletal:        General: No tenderness.  Skin:    General: Skin is warm and dry.    Neurological:     Mental Status: She is alert.     Comments: Low volume speech.  Mildly confused.  Generalized weakness.    Psychiatric:        Behavior: Behavior normal.     ED Results / Procedures / Treatments   Labs (all labs ordered are listed, but only abnormal results are displayed) Labs Reviewed  COMPREHENSIVE METABOLIC PANEL - Abnormal; Notable for the following components:      Result Value   Glucose, Bld 133 (*)    BUN 31 (*)    Creatinine, Ser 1.55 (*)    GFR calc non Af Amer 28 (*)    GFR calc Af Amer 32 (*)    All other components within normal limits  CBC WITH DIFFERENTIAL/PLATELET - Abnormal; Notable for the following components:   RBC 3.72 (*)    Hemoglobin 11.2 (*)    HCT 34.6 (*)    Platelets 139 (*)    All other components within normal limits  URINALYSIS, ROUTINE W REFLEX MICROSCOPIC - Abnormal; Notable for the following components:   Glucose, UA 100 (*)    All other components within normal limits  URINE CULTURE  LIPASE, BLOOD    EKG None  Radiology No results found.  Procedures Procedures (including critical care time)  Medications Ordered in ED Medications - No data to display  ED Course  I have reviewed the triage vital signs and the nursing notes.  Pertinent labs & imaging results that were available during my care of the patient were reviewed by me and considered in my medical decision making (see chart for details).    MDM Rules/Calculators/A&P                     Patient with history of dementia here for evaluation of abdominal pain. She has minimal tenderness on examination with no peritoneal findings. UA is not consistent with UTI. Labs demonstrate stable renal insufficiency when compared to priors. A CT abdomen pelvis was obtained, with no evidence of abscess, appendicitis. CT does demonstrate some gas near the vagina.  Pt with recent purewick during ED visit in late April, not true vaginal instrumentation.  No reports of vaginal  pain or discharge.  On recheck she is asymptomatic and has no pain.  D/w daughter unclear source of sxs but feel it is safe to d/c her home with tylenol for mild pain and recheck if significant pain or new concerning symptoms.  Discussed PCP follow up.    Final Clinical Impression(s) / ED Diagnoses Final diagnoses:  Right lower quadrant abdominal pain    Rx / DC Orders ED Discharge Orders    None  Quintella Reichert, MD 12/09/19 2312

## 2019-12-09 NOTE — ED Triage Notes (Signed)
Patient is brought in by daughter. Daughter reports that for the last 2 -3 hours she has complained of pain to her right lower quadrant. Pt was holding and moaning her side. The patient denies any pain at this time even with palpitation. She was " a little bit nauseated"

## 2019-12-11 LAB — URINE CULTURE: Culture: NO GROWTH

## 2020-01-02 ENCOUNTER — Emergency Department (HOSPITAL_COMMUNITY)
Admission: EM | Admit: 2020-01-02 | Discharge: 2020-01-03 | Disposition: A | Discharge status: Home / Self Care | Payer: Medicare Other | Attending: Emergency Medicine | Admitting: Emergency Medicine

## 2020-01-02 ENCOUNTER — Emergency Department (HOSPITAL_COMMUNITY): Payer: Medicare Other

## 2020-01-02 ENCOUNTER — Other Ambulatory Visit: Payer: Self-pay

## 2020-01-02 ENCOUNTER — Encounter (HOSPITAL_COMMUNITY): Payer: Self-pay

## 2020-01-02 DIAGNOSIS — I10 Essential (primary) hypertension: Secondary | ICD-10-CM | POA: Diagnosis not present

## 2020-01-02 DIAGNOSIS — R531 Weakness: Secondary | ICD-10-CM | POA: Diagnosis not present

## 2020-01-02 DIAGNOSIS — F0390 Unspecified dementia without behavioral disturbance: Secondary | ICD-10-CM | POA: Diagnosis not present

## 2020-01-02 DIAGNOSIS — Z880 Allergy status to penicillin: Secondary | ICD-10-CM | POA: Diagnosis not present

## 2020-01-02 DIAGNOSIS — Y939 Activity, unspecified: Secondary | ICD-10-CM | POA: Diagnosis not present

## 2020-01-02 DIAGNOSIS — Z881 Allergy status to other antibiotic agents status: Secondary | ICD-10-CM | POA: Insufficient documentation

## 2020-01-02 DIAGNOSIS — Y999 Unspecified external cause status: Secondary | ICD-10-CM | POA: Insufficient documentation

## 2020-01-02 DIAGNOSIS — W19XXXA Unspecified fall, initial encounter: Secondary | ICD-10-CM | POA: Diagnosis not present

## 2020-01-02 DIAGNOSIS — S0083XA Contusion of other part of head, initial encounter: Secondary | ICD-10-CM | POA: Insufficient documentation

## 2020-01-02 DIAGNOSIS — R52 Pain, unspecified: Secondary | ICD-10-CM | POA: Diagnosis not present

## 2020-01-02 DIAGNOSIS — R0902 Hypoxemia: Secondary | ICD-10-CM | POA: Diagnosis not present

## 2020-01-02 DIAGNOSIS — S0990XA Unspecified injury of head, initial encounter: Secondary | ICD-10-CM | POA: Diagnosis not present

## 2020-01-02 DIAGNOSIS — R404 Transient alteration of awareness: Secondary | ICD-10-CM | POA: Diagnosis not present

## 2020-01-02 DIAGNOSIS — Y92009 Unspecified place in unspecified non-institutional (private) residence as the place of occurrence of the external cause: Secondary | ICD-10-CM | POA: Diagnosis not present

## 2020-01-02 LAB — BASIC METABOLIC PANEL
Anion gap: 11 (ref 5–15)
BUN: 32 mg/dL — ABNORMAL HIGH (ref 8–23)
CO2: 30 mmol/L (ref 22–32)
Calcium: 8.9 mg/dL (ref 8.9–10.3)
Chloride: 100 mmol/L (ref 98–111)
Creatinine, Ser: 1.61 mg/dL — ABNORMAL HIGH (ref 0.44–1.00)
GFR calc Af Amer: 31 mL/min — ABNORMAL LOW (ref >60)
GFR calc non Af Amer: 27 mL/min — ABNORMAL LOW (ref >60)
Glucose, Bld: 136 mg/dL — ABNORMAL HIGH (ref 70–99)
Potassium: 4 mmol/L (ref 3.5–5.1)
Sodium: 141 mmol/L (ref 135–145)

## 2020-01-02 LAB — CBC WITH DIFFERENTIAL/PLATELET
Abs Immature Granulocytes: 0.04 10*3/uL (ref 0.00–0.07)
Basophils Absolute: 0 10*3/uL (ref 0.0–0.1)
Basophils Relative: 0 %
Eosinophils Absolute: 0 10*3/uL (ref 0.0–0.5)
Eosinophils Relative: 0 %
HCT: 32.5 % — ABNORMAL LOW (ref 36.0–46.0)
Hemoglobin: 10.3 g/dL — ABNORMAL LOW (ref 12.0–15.0)
Immature Granulocytes: 0 %
Lymphocytes Relative: 4 %
Lymphs Abs: 0.4 10*3/uL — ABNORMAL LOW (ref 0.7–4.0)
MCH: 29.4 pg (ref 26.0–34.0)
MCHC: 31.7 g/dL (ref 30.0–36.0)
MCV: 92.9 fL (ref 80.0–100.0)
Monocytes Absolute: 0.5 10*3/uL (ref 0.1–1.0)
Monocytes Relative: 5 %
Neutro Abs: 9.3 10*3/uL — ABNORMAL HIGH (ref 1.7–7.7)
Neutrophils Relative %: 91 %
Platelets: 187 10*3/uL (ref 150–400)
RBC: 3.5 MIL/uL — ABNORMAL LOW (ref 3.87–5.11)
RDW: 12.9 % (ref 11.5–15.5)
WBC: 10.3 10*3/uL (ref 4.0–10.5)
nRBC: 0 % (ref 0.0–0.2)

## 2020-01-02 LAB — URINALYSIS, ROUTINE W REFLEX MICROSCOPIC
Bacteria, UA: NONE SEEN
Bilirubin Urine: NEGATIVE
Glucose, UA: NEGATIVE mg/dL
Ketones, ur: NEGATIVE mg/dL
Leukocytes,Ua: NEGATIVE
Nitrite: NEGATIVE
Protein, ur: 30 mg/dL — AB
Specific Gravity, Urine: 1.012 (ref 1.005–1.030)
pH: 5 (ref 5.0–8.0)

## 2020-01-02 LAB — CK: Total CK: 64 U/L (ref 38–234)

## 2020-01-02 NOTE — ED Notes (Signed)
Pt transported to CT at this time.

## 2020-01-02 NOTE — ED Provider Notes (Signed)
[facility] DEPT Provider Note   CSN: [id number] Arrival date & time: 01/02/20  1135     History Chief Complaint  Patient presents with  . Fall  . Hypertension    Pamela Woods is a 86 y.o. female.  86 year old female history of dementia who had a witnessed fall just prior to arrival.  Was found on floor and some right-sided facial bruising was noted.  Patient's baseline is that she cannot communicate or carry conversation.  No other injuries were noted.  She does not take any blood thinners.  Sent here for further evaluation        Past Medical History:  Diagnosis Date  . Bladder cancer ([place])   . Dementia ([place])   . Hyperlipidemia   . Hypertension     Patient Active Problem List   Diagnosis Date Noted  . Bilateral hearing loss 04/26/2018  . Bilateral impacted cerumen 04/26/2018    Past Surgical History:  Procedure Laterality Date  . CHOLECYSTECTOMY    . RADICAL HYSTERECTOMY WITH TRANSPOSITION OF OVARIES Bilateral    removal of ovaries     OB History   No obstetric history on file.     History reviewed. No pertinent family history.  Social History   Tobacco Use  . Smoking status: Never Smoker  . Smokeless tobacco: Never Used  Substance Use Topics  . Alcohol use: Never  . Drug use: Never    Home Medications Prior to Admission medications   Medication Sig Start Date End Date Taking? Authorizing Provider  atorvastatin (LIPITOR) 20 MG tablet Take 20 mg by mouth daily. 03/06/18   [provider]  cholecalciferol (VITAMIN D) 1000 UNITS tablet Take 1,000 Units by mouth daily.    [provider]  Cyanocobalamin (VITAMIN B-12 PO) Take by mouth.    [provider]  famotidine (PEPCID) 40 MG tablet TAKE 1 TABLET BY MOUTH EVERYDAY AT BEDTIME 06/28/18   [provider]  furosemide (LASIX) 20 MG tablet TAKE 1/2 TABLET ONCE A DAY 02/08/18   [provider]  IRON CR PO Take by mouth.     [provider]  Multiple Vitamins-Minerals (PRESERVISION AREDS PO) Take by mouth.    [provider]  olmesartan (BENICAR) 20 MG tablet Take 20 mg by mouth daily. 01/08/18   [provider]  pantoprazole (PROTONIX) 40 MG tablet Take 40 mg by mouth daily. 04/07/18   [provider]  ranitidine (ZANTAC) 150 MG tablet Take 150 mg by mouth daily.     [provider]  sertraline (ZOLOFT) 25 MG tablet Take 25 mg by mouth daily. 08/02/19   [provider]  vitamin C (ASCORBIC ACID) 500 MG tablet Take 500 mg by mouth daily.    [provider]    Allergies    Ampicillin, Cephalosporins, Doxycycline, Nabumetone, Nitrofurantoin, Other, and Sulfur  Review of Systems   Review of Systems  Unable to perform ROS: Dementia    Physical Exam Updated Vital Signs BP (!) 231/93 (BP Location: Left Arm)   Pulse 89   Temp 97.9 F (36.6 C) (Rectal)   Resp 16   SpO2 100%   Physical Exam Vitals and nursing note reviewed.  Constitutional:      General: She is not in acute distress.    Appearance: Normal appearance. She is well-developed. She is not toxic-appearing.  HENT:     Head: Contusion present.   Eyes:     General: Lids are normal.  Conjunctiva/sclera: Conjunctivae normal.     Pupils: Pupils are equal, round, and reactive to light.  Neck:     Thyroid: No thyroid mass.     Trachea: No tracheal deviation.  Cardiovascular:     Rate and Rhythm: Normal rate and regular rhythm.     Heart sounds: Normal heart sounds. No murmur. No gallop.   Pulmonary:     Effort: Pulmonary effort is normal. No respiratory distress.     Breath sounds: Normal breath sounds. No stridor. No decreased breath sounds, wheezing, rhonchi or rales.  Abdominal:     General: Bowel sounds are normal. There is no distension.     Palpations: Abdomen is soft.     Tenderness: There is no abdominal tenderness. There is no rebound.  Musculoskeletal:        General: No  tenderness. Normal range of motion.     Cervical back: Normal range of motion and neck supple.  Skin:    General: Skin is warm and dry.     Findings: No abrasion or rash.  Neurological:     Mental Status: She is alert. She is disoriented.     GCS: GCS eye subscore is 4. GCS verbal subscore is 4. GCS motor subscore is 5.     Cranial Nerves: No cranial nerve deficit.     Sensory: No sensory deficit.     Comments: Patient withdraws to pain in all 4 extremities  Psychiatric:        Attention and Perception: She is inattentive.     ED Results / Procedures / Treatments   Labs (all labs ordered are listed, but only abnormal results are displayed) Labs Reviewed  CBC WITH DIFFERENTIAL/PLATELET  BASIC METABOLIC PANEL  CK  URINALYSIS, ROUTINE W REFLEX MICROSCOPIC    EKG None  Radiology No results found.  Procedures Procedures (including critical care time)  Medications Ordered in ED Medications - No data to display  ED Course  I have reviewed the triage vital signs and the nursing notes.  Pertinent labs & imaging results that were available during my care of the patient were reviewed by me and considered in my medical decision making (see chart for details).    MDM Rules/Calculators/A&P                      CT of head, maxillofacial and neck without acute findings.  Patient is urinalysis negative.  Labs are reassuring here.  CK normal.  Discussed with patient's daughter who states patient is at her baseline.  Will discharge home Final Clinical Impression(s) / ED Diagnoses Final diagnoses:  None    Rx / DC Orders ED Discharge Orders    None       Lacretia Leigh, MD 01/02/20 1510

## 2020-01-02 NOTE — ED Notes (Addendum)
Called [facility] for ETA

## 2020-01-02 NOTE — ED Triage Notes (Signed)
Per EMS-family found patient on floor, patient usually ambulates with walker-patient is altered at baseline-right facial bruising, no blood thinners-recent weight loss-BP 220/100

## 2020-01-02 NOTE — ED Notes (Signed)
Report provided to [facility] at this time.

## 2020-01-03 NOTE — ED Notes (Addendum)
Patient's daughter notified she is on her way home. Patient unable to sign for dc.

## 2020-01-09 ENCOUNTER — Other Ambulatory Visit: Payer: Self-pay

## 2020-01-09 ENCOUNTER — Ambulatory Visit (INDEPENDENT_AMBULATORY_CARE_PROVIDER_SITE_OTHER): Payer: Medicare Other | Admitting: Podiatry

## 2020-01-09 ENCOUNTER — Encounter: Payer: Self-pay | Admitting: Podiatry

## 2020-01-09 DIAGNOSIS — M79675 Pain in left toe(s): Secondary | ICD-10-CM | POA: Diagnosis not present

## 2020-01-09 DIAGNOSIS — L608 Other nail disorders: Secondary | ICD-10-CM

## 2020-01-09 DIAGNOSIS — M79674 Pain in right toe(s): Secondary | ICD-10-CM | POA: Diagnosis not present

## 2020-01-09 DIAGNOSIS — B351 Tinea unguium: Secondary | ICD-10-CM | POA: Diagnosis not present

## 2020-01-09 NOTE — Progress Notes (Signed)
This patient returns to the office for evaluation and treatment of long thick painful nails .  This patient is unable to trim his own nails since the patient cannot reach the feet.  Patient says the nails are painful walking and wearing her shoes. She  returns for preventive foot care services.  She presents to the office with her daughter.  General Appearance  Alert, conversant and in no acute stress.  Vascular  Dorsalis pedis and posterior tibial  pulses are palpable  bilaterally.  Capillary return is within normal limits  bilaterally. Temperature is within normal limits  bilaterally.  Neurologic  Senn-Weinstein monofilament wire test within normal limits  bilaterally. Muscle power within normal limits bilaterally.  Nails Thick disfigured discolored nails with subungual debris  from hallux to fifth toes bilaterally.  Pincer nails hallux  B/L. No evidence of bacterial infection or drainage bilaterally.  Orthopedic  No limitations of motion  feet .  No crepitus or effusions noted.  No bony pathology or digital deformities noted.  Skin  normotropic skin with no porokeratosis noted bilaterally.  No signs of infections or ulcers noted.     Onychomycosis  Pain in toes right foot  Pain in toes left foot  Debridement  of nails  1-5  B/L with a nail nipper.  Nails were then filed using a dremel tool with no incidents.    RTC  9 weeks.   Gardiner Barefoot DPM

## 2020-01-30 DIAGNOSIS — Z111 Encounter for screening for respiratory tuberculosis: Secondary | ICD-10-CM | POA: Diagnosis not present

## 2020-02-01 DIAGNOSIS — Z111 Encounter for screening for respiratory tuberculosis: Secondary | ICD-10-CM | POA: Diagnosis not present

## 2020-02-06 DIAGNOSIS — Z20828 Contact with and (suspected) exposure to other viral communicable diseases: Secondary | ICD-10-CM | POA: Diagnosis not present

## 2020-02-12 ENCOUNTER — Other Ambulatory Visit: Payer: Self-pay

## 2020-02-12 ENCOUNTER — Non-Acute Institutional Stay: Payer: Medicare Other

## 2020-02-12 DIAGNOSIS — M6281 Muscle weakness (generalized): Secondary | ICD-10-CM | POA: Diagnosis not present

## 2020-02-12 DIAGNOSIS — N184 Chronic kidney disease, stage 4 (severe): Secondary | ICD-10-CM | POA: Diagnosis not present

## 2020-02-12 DIAGNOSIS — R2681 Unsteadiness on feet: Secondary | ICD-10-CM | POA: Diagnosis not present

## 2020-02-12 DIAGNOSIS — E782 Mixed hyperlipidemia: Secondary | ICD-10-CM | POA: Diagnosis not present

## 2020-02-12 DIAGNOSIS — I10 Essential (primary) hypertension: Secondary | ICD-10-CM | POA: Diagnosis not present

## 2020-02-12 DIAGNOSIS — R7303 Prediabetes: Secondary | ICD-10-CM | POA: Diagnosis not present

## 2020-02-12 DIAGNOSIS — M8580 Other specified disorders of bone density and structure, unspecified site: Secondary | ICD-10-CM | POA: Diagnosis not present

## 2020-02-12 DIAGNOSIS — E559 Vitamin D deficiency, unspecified: Secondary | ICD-10-CM | POA: Diagnosis not present

## 2020-02-12 DIAGNOSIS — D649 Anemia, unspecified: Secondary | ICD-10-CM | POA: Diagnosis not present

## 2020-02-12 DIAGNOSIS — Z515 Encounter for palliative care: Secondary | ICD-10-CM

## 2020-02-12 DIAGNOSIS — K219 Gastro-esophageal reflux disease without esophagitis: Secondary | ICD-10-CM | POA: Diagnosis not present

## 2020-02-13 ENCOUNTER — Telehealth: Payer: Self-pay

## 2020-02-13 DIAGNOSIS — F0281 Dementia in other diseases classified elsewhere with behavioral disturbance: Secondary | ICD-10-CM | POA: Diagnosis not present

## 2020-02-13 DIAGNOSIS — F331 Major depressive disorder, recurrent, moderate: Secondary | ICD-10-CM | POA: Diagnosis not present

## 2020-02-13 DIAGNOSIS — G301 Alzheimer's disease with late onset: Secondary | ICD-10-CM | POA: Diagnosis not present

## 2020-02-13 NOTE — Telephone Encounter (Signed)
(  10:30am) SW received a return call from patient's daugher-Pamela Woods. SW provided introduction to palliative care team, services and updated her on the visit. Education was provided on palliative care services, visit frequency and eligibility criteria. SW provided update to daughter on visit with patient and decline noted (decreased intake to bites/sips, pushing away food and medications, patient weight loss, weakness, increased cognitive deficits, frail and thin appearance) and discussed hospice care with daughter. SW advised that she did consult with Legrand Como, resident care coordinator and palliative care RN-M. Howard regarding noted decline. SW discussed services available for patient and family under hospice care. Daughter asked if patient would be appropriate for rehab therapies. SW advised that progress in rehab therapies maybe minimal due to decreased cognitive state. Daughter agreed to hospice consult. SW discussed patient's code status with her and she requested that patient be a DNR. SW advised that hospice consult will be requested, however if patient does not meet criteria then palliative services will be continued. Pamela Woods verbalized understanding of information provided, request for DNR status and hospice consult.   **DNR placed on patient's chart; Legrand Como, resident care coordinator updated on telephone conversation with daughter and her agreement to hospice consult. Legrand Como to discuss further with patient's primary care physician for hospice consult.**

## 2020-02-14 NOTE — Progress Notes (Signed)
COMMUNITY PALLIATIVE CARE SW NOTE  PATIENT NAME: Pamela Woods DOB: 1921/10/23 MRN: [id number]  PRIMARY CARE PROVIDER: Orpah Melter, MD  RESPONSIBLE PARTY:  Acct ID - Guarantor Home Phone Work Phone Relationship Acct Type  [id number] Florencia [id number] [phone]  Self P/F     [address], [place], [place] [address]     PLAN OF CARE and INTERVENTIONS:             1. GOALS OF CARE/ ADVANCE CARE PLANNING:  Patient code status to be discussed. Goal is for patient to regain some strength. 2. SOCIAL/EMOTIONAL/SPIRITUAL ASSESSMENT/ INTERVENTIONS:  SW completed a face-to-face visit with patient at the facility ([facility]). Patient was sitting in her wheelchair, awake, alert and leaning forward. She made nonsensical verbalizations. She initially did not give SW eye contact, but later she did. Patient is thin, frail and pale in appearance. Her frame is boney throughout all extremities. She did not appear to be in any pain or discomfort. SW sat with patient, making positive statements to her and providing a supportive presence. SW consulted with the Med tech-Kennedy, who reported that patient appears to be stiff and have discomfort with any movement. She could stand and pivot two weeks ago, but is now non-weight bearing. She is eating less than 10% of meals, usually taking only a few bites of food. Patient often pushes food and medications away. Patient has been at the facility for about a week. She needs constant redirection from staff as she attempts to get up from her chair unassisted and is at risk for falls. Patient if fully vaccinated ([employer]).      Patient moved to facility about a week ago and lived at home with her daughter prior. She is widowed. Patient is Theatre manager by faith. She enjoyed sewing and embroidery and planting. SW provided supportive presence and positive statements to the patient, while assessing her needs and comfort and consult with staff. SW left patient's  daughter-Geraldine a message updating her on visit and requesting a call back.  3. PATIENT/CAREGIVER EDUCATION/ COPING:  Patient seems to be coping adequately in her adjustment to the facility. She has a flat affect, pleasantly confused and dependent for all ADL's.  4. PERSONAL EMERGENCY PLAN: Per facility protocol 5. COMMUNITY RESOURCES COORDINATION/ HEALTH CARE NAVIGATION:  Patient has access to activities and socialization support. 6. FINANCIAL/LEGAL CONCERNS/INTERVENTIONS:  No financial or legal concerns noted.     SOCIAL HX:  Social History   Tobacco Use  . Smoking status: Never Smoker  . Smokeless tobacco: Never Used  Substance Use Topics  . Alcohol use: Never    CODE STATUS: DNR requested ADVANCED DIRECTIVES: Yes, on chart MOST FORM COMPLETE:  No HOSPICE EDUCATION PROVIDED: Yes, discussed with staff  PPS: Patient is pleasantly confused, nonsensical verbalizations, ambulates self in wheelchair, high fall risk, thin, frail and pale in appearance. She is dependent for all ADL's.  Duration of visit and documentation: 60 minutes.     [address] [place], [place]

## 2020-02-15 DIAGNOSIS — E039 Hypothyroidism, unspecified: Secondary | ICD-10-CM | POA: Diagnosis not present

## 2020-02-15 DIAGNOSIS — E119 Type 2 diabetes mellitus without complications: Secondary | ICD-10-CM | POA: Diagnosis not present

## 2020-02-15 DIAGNOSIS — E559 Vitamin D deficiency, unspecified: Secondary | ICD-10-CM | POA: Diagnosis not present

## 2020-02-15 DIAGNOSIS — D519 Vitamin B12 deficiency anemia, unspecified: Secondary | ICD-10-CM | POA: Diagnosis not present

## 2020-02-15 DIAGNOSIS — I10 Essential (primary) hypertension: Secondary | ICD-10-CM | POA: Diagnosis not present

## 2020-02-19 DIAGNOSIS — R7303 Prediabetes: Secondary | ICD-10-CM | POA: Diagnosis not present

## 2020-02-19 DIAGNOSIS — E559 Vitamin D deficiency, unspecified: Secondary | ICD-10-CM | POA: Diagnosis not present

## 2020-02-19 DIAGNOSIS — N184 Chronic kidney disease, stage 4 (severe): Secondary | ICD-10-CM | POA: Diagnosis not present

## 2020-02-19 DIAGNOSIS — I10 Essential (primary) hypertension: Secondary | ICD-10-CM | POA: Diagnosis not present

## 2020-02-19 DIAGNOSIS — K219 Gastro-esophageal reflux disease without esophagitis: Secondary | ICD-10-CM | POA: Diagnosis not present

## 2020-02-19 DIAGNOSIS — D649 Anemia, unspecified: Secondary | ICD-10-CM | POA: Diagnosis not present

## 2020-02-19 DIAGNOSIS — E782 Mixed hyperlipidemia: Secondary | ICD-10-CM | POA: Diagnosis not present

## 2020-02-20 DIAGNOSIS — K219 Gastro-esophageal reflux disease without esophagitis: Secondary | ICD-10-CM | POA: Diagnosis not present

## 2020-02-20 DIAGNOSIS — Z8551 Personal history of malignant neoplasm of bladder: Secondary | ICD-10-CM | POA: Diagnosis not present

## 2020-02-20 DIAGNOSIS — F329 Major depressive disorder, single episode, unspecified: Secondary | ICD-10-CM | POA: Diagnosis not present

## 2020-02-20 DIAGNOSIS — N184 Chronic kidney disease, stage 4 (severe): Secondary | ICD-10-CM | POA: Diagnosis not present

## 2020-02-20 DIAGNOSIS — F0281 Dementia in other diseases classified elsewhere with behavioral disturbance: Secondary | ICD-10-CM | POA: Diagnosis not present

## 2020-02-20 DIAGNOSIS — E785 Hyperlipidemia, unspecified: Secondary | ICD-10-CM | POA: Diagnosis not present

## 2020-02-20 DIAGNOSIS — G309 Alzheimer's disease, unspecified: Secondary | ICD-10-CM | POA: Diagnosis not present

## 2020-02-20 DIAGNOSIS — D631 Anemia in chronic kidney disease: Secondary | ICD-10-CM | POA: Diagnosis not present

## 2020-02-20 DIAGNOSIS — I129 Hypertensive chronic kidney disease with stage 1 through stage 4 chronic kidney disease, or unspecified chronic kidney disease: Secondary | ICD-10-CM | POA: Diagnosis not present

## 2020-02-21 DIAGNOSIS — F0281 Dementia in other diseases classified elsewhere with behavioral disturbance: Secondary | ICD-10-CM | POA: Diagnosis not present

## 2020-02-21 DIAGNOSIS — I129 Hypertensive chronic kidney disease with stage 1 through stage 4 chronic kidney disease, or unspecified chronic kidney disease: Secondary | ICD-10-CM | POA: Diagnosis not present

## 2020-02-21 DIAGNOSIS — N184 Chronic kidney disease, stage 4 (severe): Secondary | ICD-10-CM | POA: Diagnosis not present

## 2020-02-21 DIAGNOSIS — D631 Anemia in chronic kidney disease: Secondary | ICD-10-CM | POA: Diagnosis not present

## 2020-02-21 DIAGNOSIS — F329 Major depressive disorder, single episode, unspecified: Secondary | ICD-10-CM | POA: Diagnosis not present

## 2020-02-21 DIAGNOSIS — G309 Alzheimer's disease, unspecified: Secondary | ICD-10-CM | POA: Diagnosis not present

## 2020-02-22 DIAGNOSIS — I129 Hypertensive chronic kidney disease with stage 1 through stage 4 chronic kidney disease, or unspecified chronic kidney disease: Secondary | ICD-10-CM | POA: Diagnosis not present

## 2020-02-22 DIAGNOSIS — N184 Chronic kidney disease, stage 4 (severe): Secondary | ICD-10-CM | POA: Diagnosis not present

## 2020-02-22 DIAGNOSIS — F0281 Dementia in other diseases classified elsewhere with behavioral disturbance: Secondary | ICD-10-CM | POA: Diagnosis not present

## 2020-02-22 DIAGNOSIS — D631 Anemia in chronic kidney disease: Secondary | ICD-10-CM | POA: Diagnosis not present

## 2020-02-22 DIAGNOSIS — F329 Major depressive disorder, single episode, unspecified: Secondary | ICD-10-CM | POA: Diagnosis not present

## 2020-02-22 DIAGNOSIS — G309 Alzheimer's disease, unspecified: Secondary | ICD-10-CM | POA: Diagnosis not present

## 2020-02-23 DIAGNOSIS — F0281 Dementia in other diseases classified elsewhere with behavioral disturbance: Secondary | ICD-10-CM | POA: Diagnosis not present

## 2020-02-23 DIAGNOSIS — N184 Chronic kidney disease, stage 4 (severe): Secondary | ICD-10-CM | POA: Diagnosis not present

## 2020-02-23 DIAGNOSIS — F329 Major depressive disorder, single episode, unspecified: Secondary | ICD-10-CM | POA: Diagnosis not present

## 2020-02-23 DIAGNOSIS — D631 Anemia in chronic kidney disease: Secondary | ICD-10-CM | POA: Diagnosis not present

## 2020-02-23 DIAGNOSIS — I129 Hypertensive chronic kidney disease with stage 1 through stage 4 chronic kidney disease, or unspecified chronic kidney disease: Secondary | ICD-10-CM | POA: Diagnosis not present

## 2020-02-23 DIAGNOSIS — G309 Alzheimer's disease, unspecified: Secondary | ICD-10-CM | POA: Diagnosis not present

## 2020-02-25 DIAGNOSIS — F329 Major depressive disorder, single episode, unspecified: Secondary | ICD-10-CM | POA: Diagnosis not present

## 2020-02-25 DIAGNOSIS — N184 Chronic kidney disease, stage 4 (severe): Secondary | ICD-10-CM | POA: Diagnosis not present

## 2020-02-25 DIAGNOSIS — K219 Gastro-esophageal reflux disease without esophagitis: Secondary | ICD-10-CM | POA: Diagnosis not present

## 2020-02-25 DIAGNOSIS — Z8551 Personal history of malignant neoplasm of bladder: Secondary | ICD-10-CM | POA: Diagnosis not present

## 2020-02-25 DIAGNOSIS — E785 Hyperlipidemia, unspecified: Secondary | ICD-10-CM | POA: Diagnosis not present

## 2020-02-25 DIAGNOSIS — F0281 Dementia in other diseases classified elsewhere with behavioral disturbance: Secondary | ICD-10-CM | POA: Diagnosis not present

## 2020-02-25 DIAGNOSIS — D631 Anemia in chronic kidney disease: Secondary | ICD-10-CM | POA: Diagnosis not present

## 2020-02-25 DIAGNOSIS — I129 Hypertensive chronic kidney disease with stage 1 through stage 4 chronic kidney disease, or unspecified chronic kidney disease: Secondary | ICD-10-CM | POA: Diagnosis not present

## 2020-02-25 DIAGNOSIS — G309 Alzheimer's disease, unspecified: Secondary | ICD-10-CM | POA: Diagnosis not present

## 2020-02-26 DIAGNOSIS — G309 Alzheimer's disease, unspecified: Secondary | ICD-10-CM | POA: Diagnosis not present

## 2020-02-26 DIAGNOSIS — F0281 Dementia in other diseases classified elsewhere with behavioral disturbance: Secondary | ICD-10-CM | POA: Diagnosis not present

## 2020-02-26 DIAGNOSIS — F329 Major depressive disorder, single episode, unspecified: Secondary | ICD-10-CM | POA: Diagnosis not present

## 2020-02-26 DIAGNOSIS — N184 Chronic kidney disease, stage 4 (severe): Secondary | ICD-10-CM | POA: Diagnosis not present

## 2020-02-26 DIAGNOSIS — D631 Anemia in chronic kidney disease: Secondary | ICD-10-CM | POA: Diagnosis not present

## 2020-02-26 DIAGNOSIS — I129 Hypertensive chronic kidney disease with stage 1 through stage 4 chronic kidney disease, or unspecified chronic kidney disease: Secondary | ICD-10-CM | POA: Diagnosis not present

## 2020-03-27 ENCOUNTER — Ambulatory Visit: Payer: PRIVATE HEALTH INSURANCE | Admitting: Podiatry

## 2020-03-27 DEATH — deceased

## 2021-01-16 IMAGING — CT CT ABD-PELV W/O CM
2 of 4 series · 16 of 46 positions shown, 18 images · non-contrast
Comparison: None.

CLINICAL DATA: Right lower quadrant pain

EXAM:
CT ABDOMEN AND PELVIS WITHOUT CONTRAST
TECHNIQUE: Multidetector CT imaging of the abdomen and pelvis was performed
following the standard protocol without IV contrast.

[Series 2: axial st · axial · 0.63mm/px · z∈[-424,-38]mm · 13 of 85 slices shown, 15 images]
[im 4/85  soft-tissue]
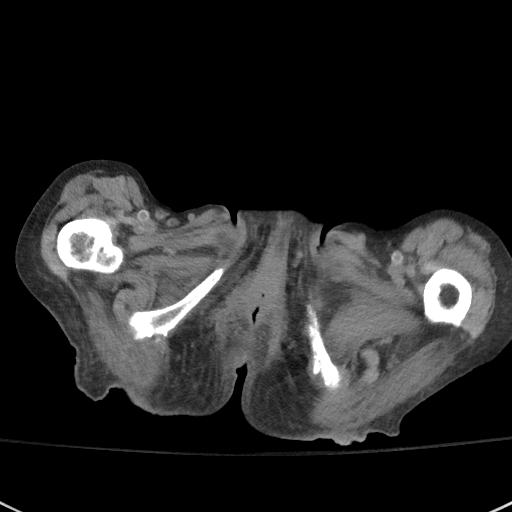
[im 4/85  bone]
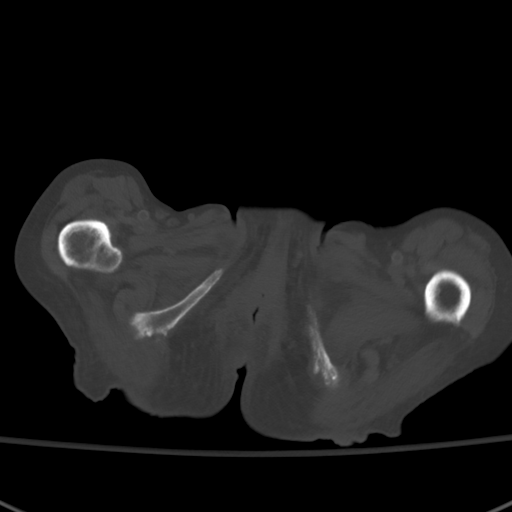
[im 11/85  soft-tissue]
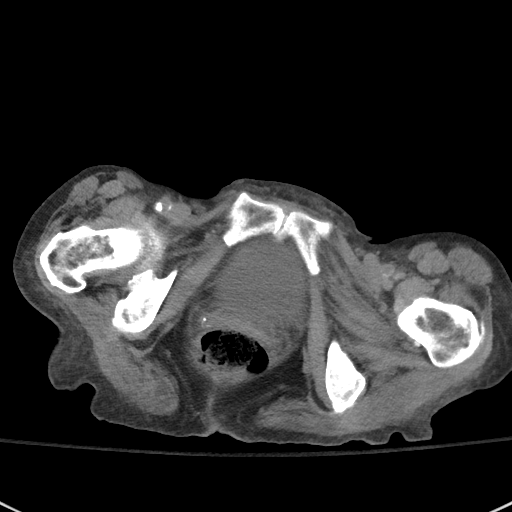
[im 17/85  soft-tissue]
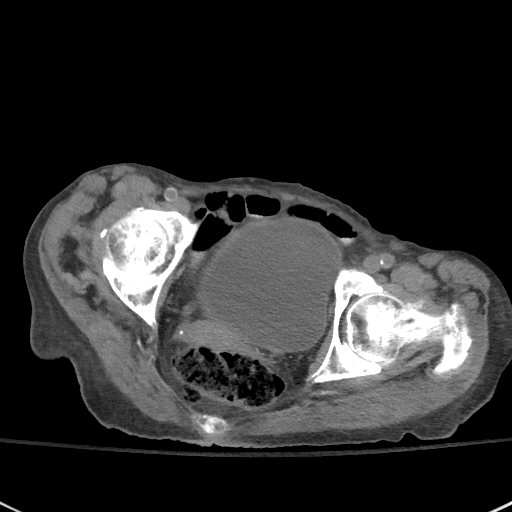
[im 24/85  soft-tissue]
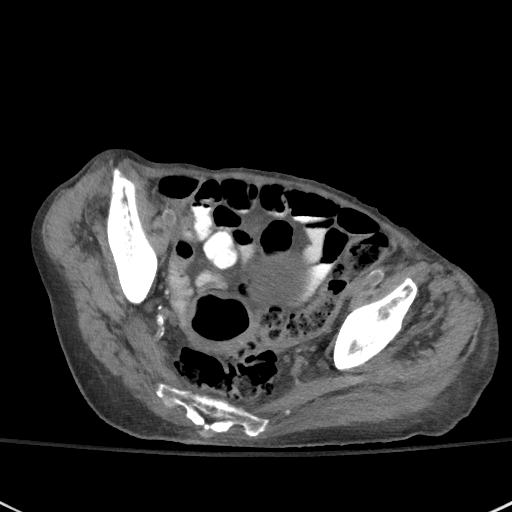
[im 31/85  soft-tissue]
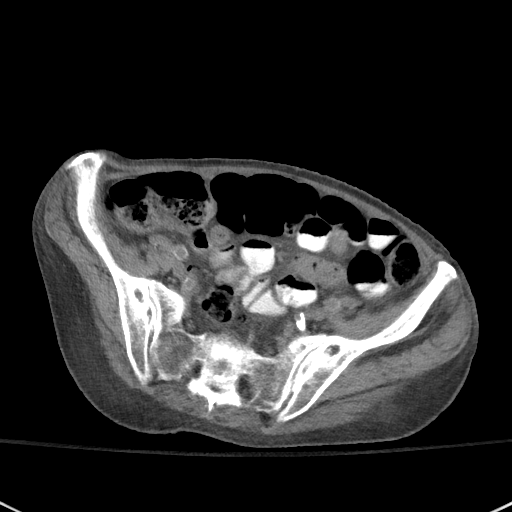
[im 37/85  soft-tissue]
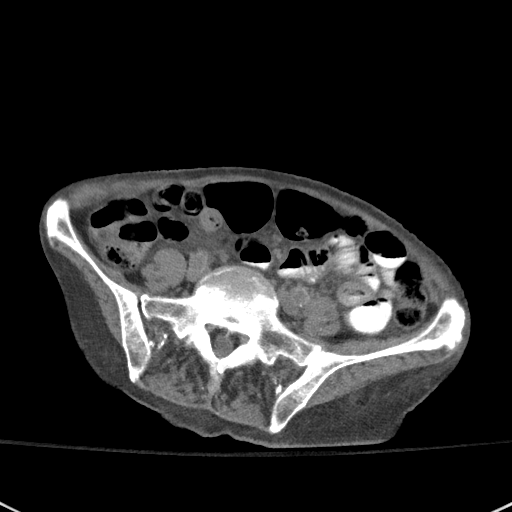
[im 44/85  soft-tissue]
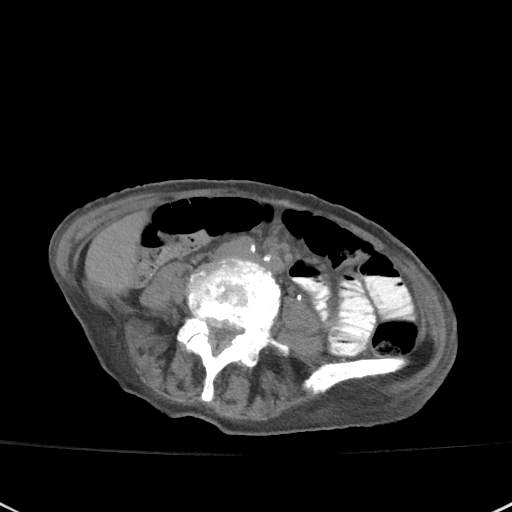
[im 48/85  soft-tissue]
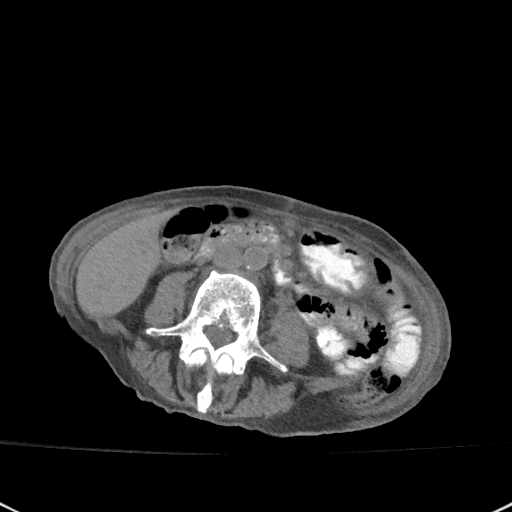
[im 54/85  soft-tissue]
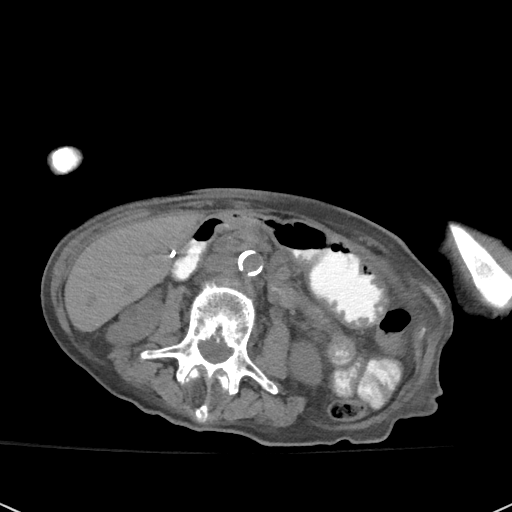
[im 54/85  bone]
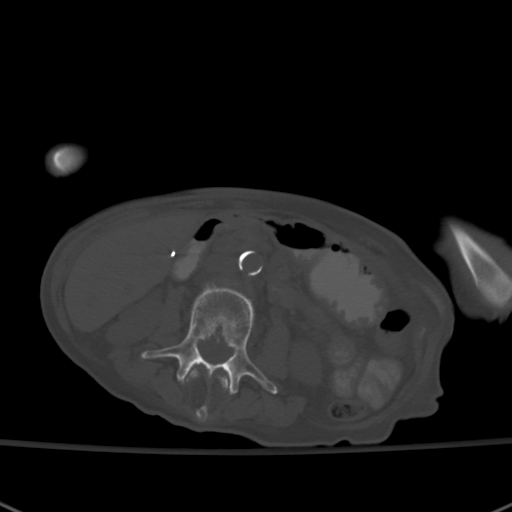
[im 61/85  soft-tissue]
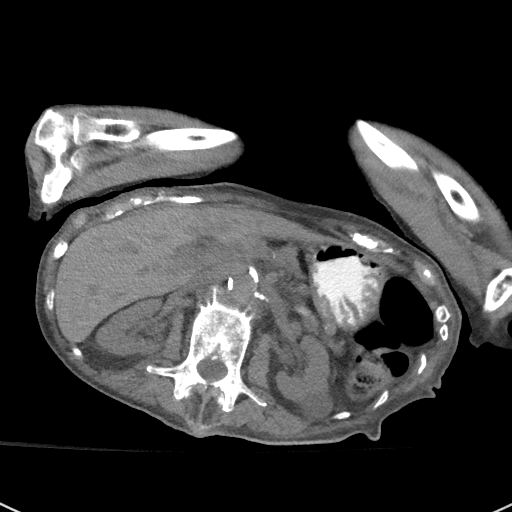
[im 68/85  soft-tissue]
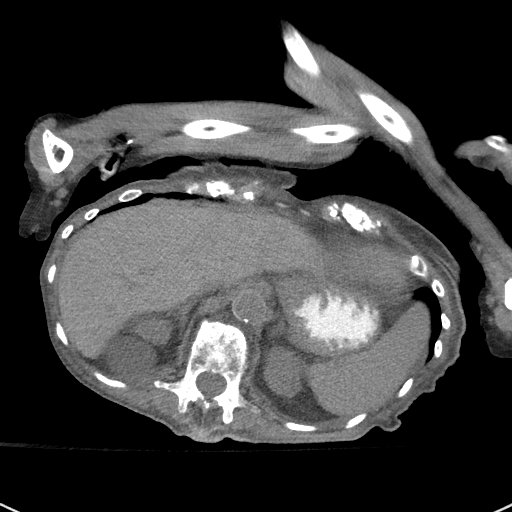
[im 74/85  soft-tissue]
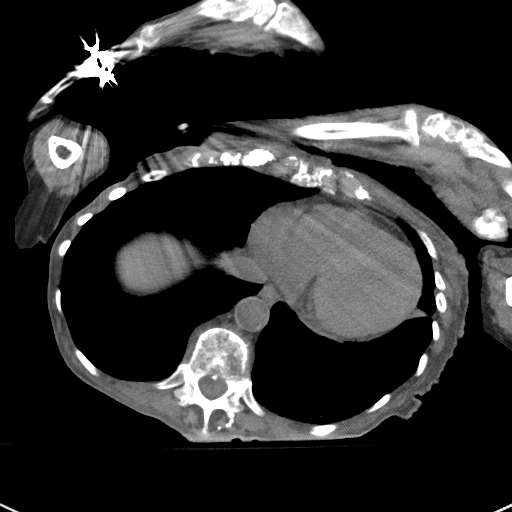
[im 81/85  soft-tissue]
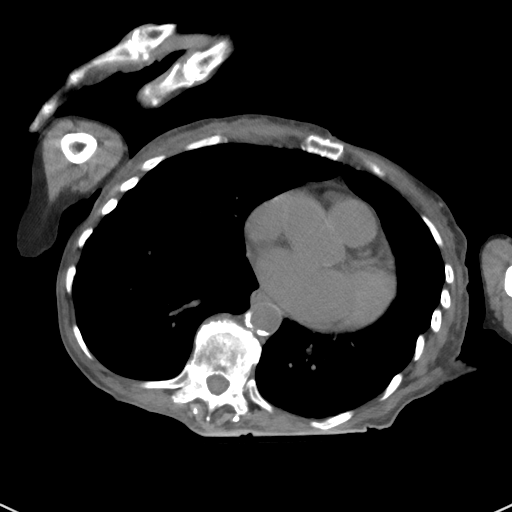

[Series 5: coronal st · coronal · 0.65mm/px · 3 of 72 slices shown]
[im 24/72  soft-tissue]
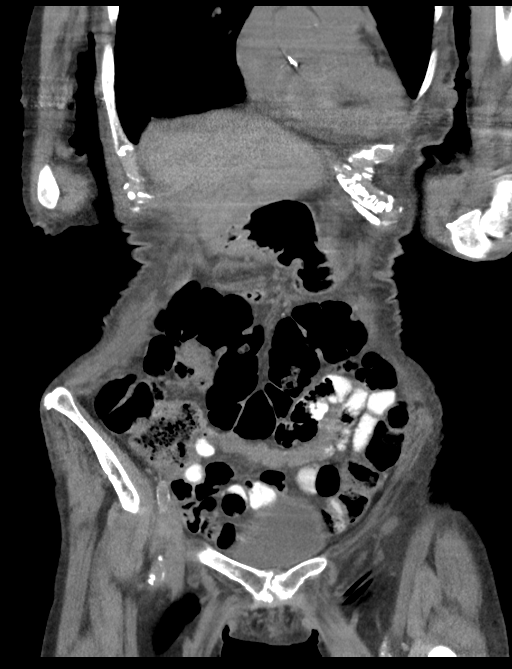
[im 32/72  soft-tissue]
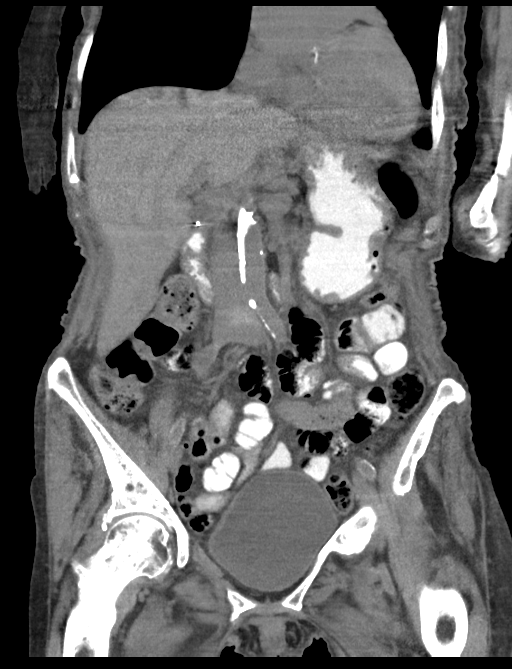
[im 40/72  soft-tissue]
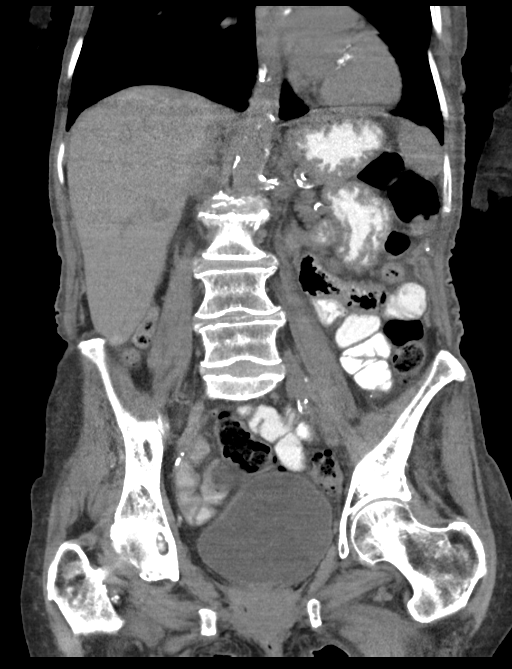

[16 of 46 positions shown; findings below may reference images not displayed]

FINDINGS: Lower chest: Lung bases demonstrate no acute consolidation or
pleural effusion. Coronary vascular calcification.

Hepatobiliary: No focal liver abnormality is seen. Status post
cholecystectomy. No biliary dilatation.

Pancreas: Unremarkable. No pancreatic ductal dilatation or
surrounding inflammatory changes.

Spleen: Normal in size without focal abnormality.

Adrenals/Urinary Tract: Adrenal glands are normal. Cysts within the
bilateral kidneys. 9 mm hyperdense lesion exophytic to the mid pole
left kidney. No hydronephrosis. Distended urinary bladder.

Stomach/Bowel: Stomach is within normal limits. Appendix appears
normal. No evidence of bowel wall thickening, distention, or
inflammatory changes.

Vascular/Lymphatic: Moderate aortic atherosclerosis without
aneurysm. No suspicious adenopathy

Reproductive: Reported hysterectomy and oophorectomy. Air containing
structure contiguous with the vagina.

Other: Negative for free air or free fluid.

Musculoskeletal: No acute or suspicious osseous abnormality
IMPRESSION: 1. No CT evidence for acute intra-abdominal or pelvic abnormality.
Negative for acute appendicitis
2. 9 mm hyperdense exophytic lesion left kidney possible hemorrhagic
or proteinaceous cysts but further characterisation limited without
contrast
3. Gas-filled structure in the posterior pelvis that appears
contiguous with the vagina, the shape is suggestive of uterus
however there is history of hysterectomy in patient's [REDACTED] chart.
Correlate with surgical history. Gas in the reproductive system
could be secondary to recent manipulation. Fistula could also be
considered, correlate for any signs of abnormal vaginal discharge.
